# Patient Record
Sex: Male | Born: 1965 | Race: White | Hispanic: No | Marital: Married | Smoking: Former smoker
Health system: Southern US, Community
[De-identification: ages and names within clinical notes are randomized; demographics above are authoritative.]

## PROBLEM LIST (undated history)

## (undated) DIAGNOSIS — J45909 Unspecified asthma, uncomplicated: Secondary | ICD-10-CM

## (undated) DIAGNOSIS — Z86010 Personal history of colonic polyps: Secondary | ICD-10-CM

## (undated) DIAGNOSIS — E291 Testicular hypofunction: Secondary | ICD-10-CM

## (undated) DIAGNOSIS — I10 Essential (primary) hypertension: Secondary | ICD-10-CM

## (undated) DIAGNOSIS — E785 Hyperlipidemia, unspecified: Secondary | ICD-10-CM

## (undated) HISTORY — DX: Testicular hypofunction: E29.1

## (undated) HISTORY — PX: TONSILLECTOMY: SUR1361

## (undated) HISTORY — DX: Essential (primary) hypertension: I10

## (undated) HISTORY — DX: Personal history of colonic polyps: Z86.010

## (undated) HISTORY — PX: OSTEOTOMY AND ULNAR SHORTENING: SHX2140

## (undated) HISTORY — PX: CLOSED REDUCTION WRIST FRACTURE: SHX1091

## (undated) HISTORY — DX: Hyperlipidemia, unspecified: E78.5

---

## 1996-02-16 HISTORY — PX: KNEE ARTHROSCOPY W/ ACL RECONSTRUCTION: SHX1858

## 1998-05-01 ENCOUNTER — Ambulatory Visit (HOSPITAL_BASED_OUTPATIENT_CLINIC_OR_DEPARTMENT_OTHER): Admission: RE | Admit: 1998-05-01 | Discharge: 1998-05-01 | Payer: Self-pay | Admitting: Orthopedic Surgery

## 2004-02-22 ENCOUNTER — Emergency Department: Payer: Self-pay | Admitting: Emergency Medicine

## 2004-02-27 ENCOUNTER — Ambulatory Visit: Payer: Self-pay | Admitting: Specialist

## 2005-03-26 ENCOUNTER — Ambulatory Visit: Payer: Self-pay | Admitting: Unknown Physician Specialty

## 2009-01-03 ENCOUNTER — Emergency Department: Payer: Self-pay | Admitting: Emergency Medicine

## 2011-08-27 ENCOUNTER — Ambulatory Visit: Payer: Self-pay | Admitting: Unknown Physician Specialty

## 2012-12-25 ENCOUNTER — Ambulatory Visit (INDEPENDENT_AMBULATORY_CARE_PROVIDER_SITE_OTHER): Payer: Self-pay | Admitting: Family Medicine

## 2012-12-25 ENCOUNTER — Encounter: Payer: Self-pay | Admitting: Family Medicine

## 2012-12-25 VITALS — BP 110/84 | HR 75 | Temp 98.3°F | Ht 65.0 in | Wt 200.0 lb

## 2012-12-25 DIAGNOSIS — Z860101 Personal history of adenomatous and serrated colon polyps: Secondary | ICD-10-CM | POA: Insufficient documentation

## 2012-12-25 DIAGNOSIS — I10 Essential (primary) hypertension: Secondary | ICD-10-CM

## 2012-12-25 DIAGNOSIS — Z86010 Personal history of colonic polyps: Secondary | ICD-10-CM

## 2012-12-25 HISTORY — DX: Essential (primary) hypertension: I10

## 2012-12-25 HISTORY — DX: Personal history of adenomatous and serrated colon polyps: Z86.0101

## 2012-12-25 HISTORY — DX: Personal history of colonic polyps: Z86.010

## 2012-12-25 NOTE — Progress Notes (Signed)
Date:  12/25/2012   Name:  John Mckinney   DOB:  28-Sep-1965   MRN:  [id number] Gender: male Age: 47 y.o.  Primary Physician:  Hannah Beat, MD   Chief Complaint: New Patient   Subjective:   History of Present Illness:  John Mckinney is a 47 y.o. pleasant patient who presents with the following:  Had been seeing Dr. Juanetta Gosling in [place], changed jobs and now living here.   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   Hypertension, has been stable. He has been on his current medications for some time.  He also has a history of adenomatous polyp, he does have a blood relative in his 60s, his brother who died of colon cancer  There are no active problems to display for this patient.   Past Medical History  Diagnosis Date  . Hypertension   . 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 Knee surgery  1998    ACL Reconstruction  . Wrist fracture surgery      with Ulnar Osteotomy    History   Social History  . Marital Status: Married    Spouse Name: N/A    Number of Children: N/A  . Years of Education: N/A   Occupational History  . Not on file.   Social History Main Topics  . Smoking status: Former Games developer  . Smokeless tobacco: Former Neurosurgeon  . Alcohol Use: Yes  . Drug Use: No  . Sexual Activity: Not on file   Other Topics Concern  . Not on file   Social History Narrative  . No narrative on file    Family History  Problem Relation Age of Onset  . Hypertension Mother   . Hyperlipidemia Father   . Hypertension Father   . Colon cancer Brother   . Hyperlipidemia Maternal Grandfather   . Hyperlipidemia Paternal Grandmother   . Hyperlipidemia Paternal Grandfather     No Known Allergies  Medication list has been reviewed and updated.  Review of Systems:   GEN: No acute illnesses, no fevers, chills. GI: No n/v/d, eating normally Pulm: No SOB Interactive and  getting along well at home.  Otherwise, ROS is as per the HPI.   Objective:   Physical Examination: BP 110/84  Pulse 75  Temp(Src) 98.3 F (36.8 C) (Oral)  Ht 5\' 5"  (1.651 m)  Wt 200 lb (90.719 kg)  BMI 33.28 kg/m2  Ideal Body Weight: Weight in (lb) to have BMI = 25: 149.9   GEN: WDWN, NAD, Non-toxic, A & O x 3 HEENT: Atraumatic, Normocephalic. Neck supple. No masses, No LAD. Ears and Nose: No external deformity. CV: RRR, No M/G/R. No JVD. No thrill. No extra heart sounds. PULM: CTA B, no wheezes, crackles, rhonchi. No retractions. No resp. distress. No accessory muscle use. EXTR: No c/c/e NEURO Normal gait.  PSYCH: Normally interactive. Conversant. Not depressed or anxious appearing.  Calm demeanor.    Assessment & Plan:    Hypertension  History of adenomatous polyp of colon  He is really doing well, and can followup when he obtains medical insurance for a full physical and laboratory work.  There are no Patient Instructions on file for this visit.  Orders Today:  No orders of the defined types were placed in this encounter.    New medications, updates to list, dose adjustments: Meds ordered this encounter  Medications  .  lisinopril (PRINIVIL,ZESTRIL) 10 MG tablet    Sig: Take 10 mg by mouth daily.  . metoprolol tartrate (LOPRESSOR) 25 MG tablet    Sig: Take 25 mg by mouth 2 (two) times daily.    Signed,  Elpidio Galea. Brycin Kille, MD, CAQ Sports Medicine  [facility] [address] [place] [address] Phone: [phone] Fax: [phone]  Updated Complete Medication List:   Medication List       This list is accurate as of: 12/25/12  2:50 PM.  Always use your most recent med list.               lisinopril 10 MG tablet  Commonly known as:  PRINIVIL,ZESTRIL  Take 10 mg by mouth daily.     metoprolol tartrate 25 MG tablet  Commonly known as:  LOPRESSOR  Take 25 mg by mouth 2 (two) times daily.

## 2012-12-25 NOTE — Progress Notes (Signed)
Pre-visit discussion using our clinic review tool. No additional management support is needed unless otherwise documented below in the visit note.  

## 2013-03-28 ENCOUNTER — Ambulatory Visit (INDEPENDENT_AMBULATORY_CARE_PROVIDER_SITE_OTHER): Payer: PRIVATE HEALTH INSURANCE | Admitting: Family Medicine

## 2013-03-28 ENCOUNTER — Encounter: Payer: Self-pay | Admitting: Family Medicine

## 2013-03-28 VITALS — BP 128/72 | HR 63 | Temp 98.0°F | Ht 65.5 in | Wt 191.5 lb

## 2013-03-28 DIAGNOSIS — Z125 Encounter for screening for malignant neoplasm of prostate: Secondary | ICD-10-CM

## 2013-03-28 DIAGNOSIS — R5381 Other malaise: Secondary | ICD-10-CM

## 2013-03-28 DIAGNOSIS — R5383 Other fatigue: Secondary | ICD-10-CM

## 2013-03-28 DIAGNOSIS — Z79899 Other long term (current) drug therapy: Secondary | ICD-10-CM

## 2013-03-28 DIAGNOSIS — Z13220 Encounter for screening for lipoid disorders: Secondary | ICD-10-CM

## 2013-03-28 DIAGNOSIS — E291 Testicular hypofunction: Secondary | ICD-10-CM

## 2013-03-28 DIAGNOSIS — Z0000 Encounter for general adult medical examination without abnormal findings: Secondary | ICD-10-CM

## 2013-03-28 DIAGNOSIS — I10 Essential (primary) hypertension: Secondary | ICD-10-CM

## 2013-03-28 LAB — BASIC METABOLIC PANEL
BUN: 17 mg/dL (ref 6–23)
CHLORIDE: 106 meq/L (ref 96–112)
CO2: 27 mEq/L (ref 19–32)
CREATININE: 0.8 mg/dL (ref 0.4–1.5)
Calcium: 9.5 mg/dL (ref 8.4–10.5)
GFR: 104.26 mL/min (ref >60.00)
Glucose, Bld: 92 mg/dL (ref 70–99)
Potassium: 5.5 mEq/L — ABNORMAL HIGH (ref 3.5–5.1)
Sodium: 141 mEq/L (ref 135–145)

## 2013-03-28 LAB — CBC WITH DIFFERENTIAL/PLATELET
BASOS ABS: 0 10*3/uL (ref 0.0–0.1)
Basophils Relative: 0.5 % (ref 0.0–3.0)
EOS ABS: 0.1 10*3/uL (ref 0.0–0.7)
Eosinophils Relative: 1.4 % (ref 0.0–5.0)
HCT: 44 % (ref 39.0–52.0)
Hemoglobin: 14.7 g/dL (ref 13.0–17.0)
Lymphocytes Relative: 24.1 % (ref 12.0–46.0)
Lymphs Abs: 1 10*3/uL (ref 0.7–4.0)
MCHC: 33.4 g/dL (ref 30.0–36.0)
MCV: 95.8 fl (ref 78.0–100.0)
MONO ABS: 0.3 10*3/uL (ref 0.1–1.0)
Monocytes Relative: 6.6 % (ref 3.0–12.0)
NEUTROS PCT: 67.4 % (ref 43.0–77.0)
Neutro Abs: 2.8 10*3/uL (ref 1.4–7.7)
PLATELETS: 234 10*3/uL (ref 150.0–400.0)
RBC: 4.59 Mil/uL (ref 4.22–5.81)
RDW: 13.1 % (ref 11.5–14.6)
WBC: 4.2 10*3/uL — AB (ref 4.5–10.5)

## 2013-03-28 LAB — PSA: PSA: 0.67 ng/mL (ref 0.10–4.00)

## 2013-03-28 LAB — HEPATIC FUNCTION PANEL
ALT: 21 U/L (ref 0–53)
AST: 21 U/L (ref 0–37)
Albumin: 4.4 g/dL (ref 3.5–5.2)
Alkaline Phosphatase: 62 U/L (ref 39–117)
BILIRUBIN DIRECT: 0.1 mg/dL (ref 0.0–0.3)
BILIRUBIN TOTAL: 1.2 mg/dL (ref 0.3–1.2)
Total Protein: 6.7 g/dL (ref 6.0–8.3)

## 2013-03-28 LAB — LIPID PANEL
CHOLESTEROL: 188 mg/dL (ref 0–200)
HDL: 39.5 mg/dL (ref >39.00)
LDL Cholesterol: 132 mg/dL — ABNORMAL HIGH (ref 0–99)
Total CHOL/HDL Ratio: 5
Triglycerides: 85 mg/dL (ref 0.0–149.0)
VLDL: 17 mg/dL (ref 0.0–40.0)

## 2013-03-28 NOTE — Progress Notes (Signed)
Date:  03/28/2013   Name:  John Mckinney   DOB:  1965/05/05   MRN:  [id number] Gender: male Age: 48 y.o.  Primary Physician:  Hannah Beat, MD   Chief Complaint: Annual Exam   Subjective:   History of Present Illness:  John Mckinney is a 48 y.o. pleasant patient who presents with the following:  Preventative Health Maintenance Visit:  Health Maintenance Summary Reviewed and updated, unless pt declines services.  Tobacco History Reviewed. Alcohol: No concerns, no excessive use Exercise Habits: Some activity, rec at least 30 mins 5 times a week STD concerns: no risk or activity to increase risk Drug Use: None Encouraged self-testicular check  Health Maintenance  Topic Date Due  . Tetanus/tdap  04/20/1982  . Influenza Vaccine  09/15/2012     There is no immunization history on file for this patient.  HTN: Tolerating all medications without side effects Stable and at goal No CP, no sob. No HA.  BP Readings from Last 3 Encounters:  03/28/13 128/72  12/25/12 110/84    Hypogonadism: he has had multiple low testosterones and was on androgel for a decade. He has been off now for a couple of years, but with decreased libido and fatigue, wants to consider going on androgel again.  Patient Active Problem List   Diagnosis Date Noted  . Hypertension 12/25/2012  . History of adenomatous polyp of colon 12/25/2012    Past Medical History  Diagnosis Date  . Hypertension 12/25/2012  . History of adenomatous polyp of colon 12/25/2012    Past Surgical History  Procedure Laterality Date  . Knee arthroscopy w/ acl reconstruction  1998    ACL Reconstruction  . Osteotomy and ulnar shortening      Ulnar and Radial fracture    History   Social History  . Marital Status: Married    Spouse Name: N/A    Number of Children: N/A  . Years of Education: N/A   Occupational History  . Not on file.   Social History Main Topics  . Smoking status: Former Games developer  .  Smokeless tobacco: Former Neurosurgeon  . Alcohol Use: Yes  . Drug Use: No  . Sexual Activity: Not on file   Other Topics Concern  . Not on file   Social History Narrative  . No narrative on file    Family History  Problem Relation Age of Onset  . Hypertension Mother   . Hyperlipidemia Father   . Hypertension Father   . Colon cancer Brother   . Hyperlipidemia Maternal Grandfather   . Hyperlipidemia Paternal Grandmother   . Hyperlipidemia Paternal Grandfather     No Known Allergies  Medication list has been reviewed and updated.  Review of Systems:  General: Denies fever, chills, sweats. No significant weight loss. FATIGUE Eyes: Denies blurring,significant itching ENT: Denies earache, sore throat, and hoarseness. Cardiovascular: Denies chest pains, palpitations, dyspnea on exertion Respiratory: Denies cough, dyspnea at rest,wheeezing Breast: no concerns about lumps GI: Denies nausea, vomiting, diarrhea, constipation, change in bowel habits, abdominal pain, melena, hematochezia GU: Denies penile discharge, ED, urinary flow / outflow problems., DECREASED libido. No STD concerns. Musculoskeletal: Denies back pain, joint pain Derm: Denies rash, itching Neuro: Denies  paresthesias, frequent falls, frequent headaches Psych: Denies depression, anxiety Endocrine: Denies cold intolerance, heat intolerance, polydipsia Heme: Denies enlarged lymph nodes Allergy: No hayfever  Objective:   Physical Examination: BP 128/72  Pulse 63  Temp(Src) 98 F (36.7 C) (Oral)  Ht 5' 5.5" (1.664  m)  Wt 191 lb 8 oz (86.864 kg)  BMI 31.37 kg/m2  SpO2 97% Ideal Body Weight: Weight in (lb) to have BMI = 25: 152.2  GEN: well developed, well nourished, no acute distress Eyes: conjunctiva and lids normal, PERRLA, EOMI ENT: TM clear, nares clear, oral exam WNL Neck: supple, no lymphadenopathy, no thyromegaly, no JVD Pulm: clear to auscultation and percussion, respiratory effort normal CV: regular  rate and rhythm, S1-S2, no murmur, rub or gallop, no bruits, peripheral pulses normal and symmetric, no cyanosis, clubbing, edema or varicosities GI: soft, non-tender; no hepatosplenomegaly, masses; active bowel sounds all quadrants GU: no hernia, testicular mass, penile discharge Lymph: no cervical, axillary or inguinal adenopathy MSK: gait normal, muscle tone and strength WNL, no joint swelling, effusions, discoloration, crepitus  SKIN: clear, good turgor, color WNL, no rashes, lesions, or ulcerations Neuro: normal mental status, normal strength, sensation, and motion Psych: alert; oriented to person, place and time, normally interactive and not anxious or depressed in appearance.   Assessment & Plan:   Health Maintenance Exam: The patient's preventative maintenance and recommended screening tests for an annual wellness exam were reviewed in full today. Brought up to date unless services declined.  Counselled on the importance of diet, exercise, and its role in overall health and mortality. The patient's FH and SH was reviewed, including their home life, tobacco status, and drug and alcohol status.   Routine general medical examination at a health care facility  Screening for lipoid disorders - Plan: Lipid panel  Encounter for long-term (current) use of other medications - Plan: Basic metabolic panel, CBC with Differential, Hepatic function panel  Special screening for malignant neoplasm of prostate - Plan: PSA  Hypogonadism male - Plan: Testosterone, free, total:  Will check test to see if appropriate now for treatment and if in the normal or abnormal range. Discussed we would have to check cbc and psa q 6 months if a candidate.   Other malaise and fatigue - Plan: Testosterone, free, total  Hypertension: stable, cont weight loss.  There are no Patient Instructions on file for this visit.  Orders Placed This Encounter  Procedures  . Basic metabolic panel  . CBC with  Differential  . Hepatic function panel  . Lipid panel  . Testosterone, free, total  . PSA    New medications, updates to list, dose adjustments: Meds ordered this encounter  Medications  . Multiple Vitamin (MULTIVITAMIN) capsule    Sig: Take 1 capsule by mouth daily.  Marland Kitchen. KRILL OIL PO    Sig: Take by mouth.  Marland Kitchen. aspirin 81 MG tablet    Sig: Take 81 mg by mouth daily.  . Cholecalciferol (VITAMIN D PO)    Sig: Take 5,000 Units by mouth daily.  . Menaquinone-7 (VITAMIN K2) 100 MCG CAPS    Sig: Take 1 tablet by mouth daily.    Signed,  Elpidio GaleaSpencer T. Lashika Erker, MD, CAQ Sports Medicine  [facility] [address] [place] [address] Phone: [phone] Fax: [phone]    Medication List       This list is accurate as of: 03/28/13  2:20 PM.  Always use your most recent med list.               aspirin 81 MG tablet  Take 81 mg by mouth daily.     KRILL OIL PO  Take by mouth.     lisinopril 10 MG tablet  Commonly known as:  PRINIVIL,ZESTRIL  Take 10 mg by mouth daily.     metoprolol tartrate 25 MG tablet  Commonly known as:  LOPRESSOR  Take 25 mg by mouth 2 (two) times daily.     multivitamin capsule  Take 1 capsule by mouth daily.     VITAMIN D PO  Take 5,000 Units by mouth daily.     Vitamin K2 100 MCG Caps  Take 1 tablet by mouth daily.

## 2013-03-28 NOTE — Progress Notes (Signed)
Pre-visit discussion using our clinic review tool. No additional management support is needed unless otherwise documented below in the visit note.  

## 2013-03-29 ENCOUNTER — Telehealth: Payer: Self-pay | Admitting: Family Medicine

## 2013-03-29 ENCOUNTER — Encounter: Payer: Self-pay | Admitting: Family Medicine

## 2013-03-29 LAB — TESTOSTERONE, FREE, TOTAL, SHBG
SEX HORMONE BINDING: 57 nmol/L (ref 13–71)
TESTOSTERONE FREE: 48.6 pg/mL (ref 47.0–244.0)
Testosterone-% Free: 1.4 % — ABNORMAL LOW (ref 1.6–2.9)
Testosterone: 351 ng/dL (ref 300–890)

## 2013-03-29 NOTE — Telephone Encounter (Signed)
Relevant patient education assigned to patient using Emmi. ° °

## 2013-04-04 ENCOUNTER — Encounter: Payer: Self-pay | Admitting: Family Medicine

## 2013-04-05 ENCOUNTER — Telehealth: Payer: Self-pay

## 2013-04-05 NOTE — Telephone Encounter (Signed)
No answer with call

## 2013-04-05 NOTE — Telephone Encounter (Signed)
Pt left v/m returning Dr Brayton Laymanoplands call; pt can be reached [phone].

## 2013-04-06 NOTE — Telephone Encounter (Signed)
See result note from labs.

## 2013-04-06 NOTE — Telephone Encounter (Signed)
Patient returned your call. He apologizes for playing phone tag. 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

## 2013-04-11 ENCOUNTER — Emergency Department: Payer: Self-pay | Admitting: Emergency Medicine

## 2013-04-11 LAB — COMPREHENSIVE METABOLIC PANEL
ALK PHOS: 71 U/L
Albumin: 4.3 g/dL (ref 3.4–5.0)
Anion Gap: 4 — ABNORMAL LOW (ref 7–16)
BUN: 16 mg/dL (ref 7–18)
Bilirubin,Total: 0.5 mg/dL (ref 0.2–1.0)
CALCIUM: 8.9 mg/dL (ref 8.5–10.1)
Chloride: 105 mmol/L (ref 98–107)
Co2: 27 mmol/L (ref 21–32)
Creatinine: 0.87 mg/dL (ref 0.60–1.30)
EGFR (Non-African Amer.): >60
GLUCOSE: 89 mg/dL (ref 65–99)
OSMOLALITY: 273 (ref 275–301)
POTASSIUM: 4.4 mmol/L (ref 3.5–5.1)
SGOT(AST): 29 U/L (ref 15–37)
SGPT (ALT): 34 U/L (ref 12–78)
Sodium: 136 mmol/L (ref 136–145)
Total Protein: 7.5 g/dL (ref 6.4–8.2)

## 2013-04-11 LAB — CBC
HCT: 42.9 % (ref 40.0–52.0)
HGB: 14.6 g/dL (ref 13.0–18.0)
MCH: 32.1 pg (ref 26.0–34.0)
MCHC: 33.9 g/dL (ref 32.0–36.0)
MCV: 95 fL (ref 80–100)
Platelet: 220 10*3/uL (ref 150–440)
RBC: 4.53 10*6/uL (ref 4.40–5.90)
RDW: 12.5 % (ref 11.5–14.5)
WBC: 14.3 10*3/uL — AB (ref 3.8–10.6)

## 2013-04-11 LAB — URINALYSIS, COMPLETE
BACTERIA: NONE SEEN
BILIRUBIN, UR: NEGATIVE
Blood: NEGATIVE
GLUCOSE, UR: NEGATIVE mg/dL (ref 0–75)
Leukocyte Esterase: NEGATIVE
Nitrite: NEGATIVE
Ph: 5 (ref 4.5–8.0)
Protein: NEGATIVE
RBC,UR: <1 /HPF (ref 0–5)
Specific Gravity: 1.02 (ref 1.003–1.030)
Squamous Epithelial: NONE SEEN
WBC UR: 1 /HPF (ref 0–5)

## 2013-04-11 LAB — CK TOTAL AND CKMB (NOT AT ARMC)
CK, TOTAL: 193 U/L
CK-MB: 1.8 ng/mL (ref 0.5–3.6)

## 2013-04-11 LAB — TROPONIN I: Troponin-I: <0.02 ng/mL

## 2013-04-11 LAB — LIPASE, BLOOD: LIPASE: 168 U/L (ref 73–393)

## 2013-04-12 ENCOUNTER — Emergency Department: Payer: Self-pay | Admitting: Emergency Medicine

## 2013-04-12 LAB — COMPREHENSIVE METABOLIC PANEL
ALBUMIN: 3.6 g/dL (ref 3.4–5.0)
AST: 21 U/L (ref 15–37)
Alkaline Phosphatase: 61 U/L
Anion Gap: 4 — ABNORMAL LOW (ref 7–16)
BUN: 15 mg/dL (ref 7–18)
Bilirubin,Total: 0.4 mg/dL (ref 0.2–1.0)
CALCIUM: 8.9 mg/dL (ref 8.5–10.1)
CO2: 29 mmol/L (ref 21–32)
Chloride: 105 mmol/L (ref 98–107)
Creatinine: 0.95 mg/dL (ref 0.60–1.30)
EGFR (African American): >60
EGFR (Non-African Amer.): >60
Glucose: 118 mg/dL — ABNORMAL HIGH (ref 65–99)
Osmolality: 278 (ref 275–301)
POTASSIUM: 4.3 mmol/L (ref 3.5–5.1)
SGPT (ALT): 28 U/L (ref 12–78)
Sodium: 138 mmol/L (ref 136–145)
Total Protein: 7.3 g/dL (ref 6.4–8.2)

## 2013-04-12 LAB — CBC WITH DIFFERENTIAL/PLATELET
BASOS PCT: 0.2 %
Basophil #: 0 10*3/uL (ref 0.0–0.1)
Eosinophil #: 0.1 10*3/uL (ref 0.0–0.7)
Eosinophil %: 0.5 %
HCT: 38 % — ABNORMAL LOW (ref 40.0–52.0)
HGB: 12.7 g/dL — AB (ref 13.0–18.0)
Lymphocyte #: 1.1 10*3/uL (ref 1.0–3.6)
Lymphocyte %: 9.1 %
MCH: 32.2 pg (ref 26.0–34.0)
MCHC: 33.4 g/dL (ref 32.0–36.0)
MCV: 97 fL (ref 80–100)
Monocyte #: 0.6 x10 3/mm (ref 0.2–1.0)
Monocyte %: 5.2 %
NEUTROS ABS: 10 10*3/uL — AB (ref 1.4–6.5)
NEUTROS PCT: 85 %
Platelet: 200 10*3/uL (ref 150–440)
RBC: 3.94 10*6/uL — AB (ref 4.40–5.90)
RDW: 12.9 % (ref 11.5–14.5)
WBC: 11.8 10*3/uL — ABNORMAL HIGH (ref 3.8–10.6)

## 2013-04-12 LAB — URINALYSIS, COMPLETE
BACTERIA: NONE SEEN
Bilirubin,UR: NEGATIVE
Blood: NEGATIVE
GLUCOSE, UR: NEGATIVE mg/dL (ref 0–75)
Ketone: NEGATIVE
Leukocyte Esterase: NEGATIVE
NITRITE: NEGATIVE
Ph: 7 (ref 4.5–8.0)
Protein: NEGATIVE
RBC,UR: <1 /HPF (ref 0–5)
SPECIFIC GRAVITY: 1.016 (ref 1.003–1.030)
Squamous Epithelial: NONE SEEN
WBC UR: NONE SEEN /HPF (ref 0–5)

## 2013-04-12 LAB — LIPASE, BLOOD: LIPASE: 145 U/L (ref 73–393)

## 2013-05-16 DIAGNOSIS — R5383 Other fatigue: Secondary | ICD-10-CM | POA: Insufficient documentation

## 2013-05-16 DIAGNOSIS — E291 Testicular hypofunction: Secondary | ICD-10-CM | POA: Insufficient documentation

## 2013-05-16 DIAGNOSIS — N401 Enlarged prostate with lower urinary tract symptoms: Secondary | ICD-10-CM

## 2013-05-16 DIAGNOSIS — N529 Male erectile dysfunction, unspecified: Secondary | ICD-10-CM | POA: Insufficient documentation

## 2013-05-16 DIAGNOSIS — N138 Other obstructive and reflux uropathy: Secondary | ICD-10-CM | POA: Insufficient documentation

## 2014-05-24 DIAGNOSIS — Z79899 Other long term (current) drug therapy: Secondary | ICD-10-CM | POA: Insufficient documentation

## 2014-05-24 DIAGNOSIS — K4020 Bilateral inguinal hernia, without obstruction or gangrene, not specified as recurrent: Secondary | ICD-10-CM | POA: Insufficient documentation

## 2014-07-22 ENCOUNTER — Ambulatory Visit: Payer: Self-pay | Admitting: Family Medicine

## 2014-08-16 ENCOUNTER — Encounter: Payer: Self-pay | Admitting: Family Medicine

## 2014-08-16 ENCOUNTER — Ambulatory Visit (INDEPENDENT_AMBULATORY_CARE_PROVIDER_SITE_OTHER): Payer: PRIVATE HEALTH INSURANCE | Admitting: Family Medicine

## 2014-08-16 VITALS — BP 125/80 | HR 65 | Temp 98.1°F | Resp 16 | Ht 67.0 in | Wt 190.6 lb

## 2014-08-16 DIAGNOSIS — M25511 Pain in right shoulder: Secondary | ICD-10-CM | POA: Diagnosis not present

## 2014-08-16 DIAGNOSIS — E785 Hyperlipidemia, unspecified: Secondary | ICD-10-CM | POA: Insufficient documentation

## 2014-08-16 DIAGNOSIS — I10 Essential (primary) hypertension: Secondary | ICD-10-CM

## 2014-08-16 DIAGNOSIS — M79671 Pain in right foot: Secondary | ICD-10-CM | POA: Diagnosis not present

## 2014-08-16 MED ORDER — METOPROLOL TARTRATE 25 MG PO TABS: 25.0000 mg | ORAL_TABLET | Freq: Two times a day (BID) | ORAL | Status: DC

## 2014-08-16 MED ORDER — LISINOPRIL 10 MG PO TABS: 10.0000 mg | ORAL_TABLET | Freq: Every day | ORAL | Status: DC

## 2014-08-16 NOTE — Progress Notes (Signed)
Name: John SenterGregory C Righter   MRN: [id number]    DOB: 02/17/1965   Date:08/16/2014       Progress Note  Subjective  Chief Complaint  Chief Complaint  Patient presents with  . Hyperlipidemia    F/U  . Foot Injury    Pain in Right FFot 6 mo  . Shoulder Pain    Right 5-6 months     HPI  Here for elevated lipids, but would like to delay testing for a few months.   C/o R lateral pain  X 6 months.  No acute injury.  Pain worse with post running and prolonged  Standing.  C/o R. shoulder pain.  No acute injury.  Pain for 6 mos. Has crepitus.  Pain with internal and external rotation and with sleeping on R. side Past Medical History  Diagnosis Date  . Hypertension 12/25/2012  . History of adenomatous polyp of colon 12/25/2012    Past Surgical History  Procedure Laterality Date  . Knee arthroscopy w/ acl reconstruction  1998    ACL Reconstruction  . Osteotomy and ulnar shortening      Ulnar and Radial fracture    Family History  Problem Relation Age of Onset  . Hypertension Mother   . Hyperlipidemia Father   . Hypertension Father   . Colon cancer Brother   . Hyperlipidemia Maternal Grandfather   . Hyperlipidemia Paternal Grandmother   . Hyperlipidemia Paternal Grandfather     History   Social History  . Marital Status: Married    Spouse Name: N/A  . Number of Children: N/A  . Years of Education: N/A   Occupational History  . Not on file.   Social History Main Topics  . Smoking status: Former Games developermoker  . Smokeless tobacco: Former NeurosurgeonUser  . Alcohol Use: No  . Drug Use: No  . Sexual Activity: Not on file   Other Topics Concern  . Not on file   Social History Narrative     Current outpatient prescriptions:  .  aspirin 81 MG tablet, Take 81 mg by mouth daily., Disp: , Rfl:  .  Cholecalciferol (VITAMIN D PO), Take 5,000 Units by mouth daily., Disp: , Rfl:  .  KRILL OIL PO, Take by mouth., Disp: , Rfl:  .  lisinopril (PRINIVIL,ZESTRIL) 10 MG tablet, Take 10 mg by mouth  daily., Disp: , Rfl:  .  Menaquinone-7 (VITAMIN K2) 100 MCG CAPS, Take 1 tablet by mouth daily., Disp: , Rfl:  .  metoprolol tartrate (LOPRESSOR) 25 MG tablet, Take 25 mg by mouth 2 (two) times daily., Disp: , Rfl:  .  Multiple Vitamin (MULTIVITAMIN) capsule, Take 1 capsule by mouth daily., Disp: , Rfl:  .  testosterone (ANDROGEL) 50 MG/5GM (1%) GEL, Place 10 g onto the skin daily., Disp: , Rfl:   No Known Allergies   Review of Systems  Constitutional: Negative.  Negative for fever, chills and malaise/fatigue.  HENT: Negative.   Eyes: Negative.  Negative for blurred vision and double vision.  Respiratory: Negative.  Negative for cough, sputum production, shortness of breath and wheezing.   Cardiovascular: Negative.  Negative for chest pain, palpitations, orthopnea and leg swelling.  Gastrointestinal: Negative.  Negative for heartburn, nausea, vomiting, abdominal pain, diarrhea and blood in stool.  Genitourinary: Negative for dysuria and frequency.  Musculoskeletal: Positive for joint pain (R/ shuolder and foot pain).  Skin: Negative.  Negative for rash.  Neurological: Negative for weakness and headaches.      Objective  Filed Vitals:  08/16/14 1605  BP: 148/88  Pulse: 65  Temp: 98.1 F (36.7 C)  Resp: 16  Height:  (1.702 m)  Weight: 190 lb 9.6 oz (86.456 kg)    Physical Exam  Constitutional: He is well-developed, well-nourished, and in no distress. No distress.  HENT:  Head: Normocephalic and atraumatic.  Eyes: Conjunctivae and EOM are normal. Pupils are equal, round, and reactive to light. No scleral icterus.  Neck: Normal range of motion. Neck supple. No thyromegaly present.  Cardiovascular: Normal rate, regular rhythm, normal heart sounds and intact distal pulses.   Occasional extrasystoles are present. Exam reveals no gallop and no friction rub.   No murmur heard. Pulmonary/Chest: Effort normal and breath sounds normal. No respiratory distress. He has no  wheezes. He has no rales.  Abdominal: Soft. Bowel sounds are normal. He exhibits no mass. There is no tenderness.  Musculoskeletal: He exhibits no edema.  Tenderness to palp. Along proximal R. 5th metatarsal.  Pain  Along R. Ant. Shoulder line.  Crepitus with shoulder ROM.  Pain ant. With intern al and external rotation.  Lymphadenopathy:    He has no cervical adenopathy.  Vitals reviewed.        Assessment & Plan  Problem List Items Addressed This Visit      Other   Dyslipidemia - Primary    Other Visit Diagnoses    Right foot pain        Shoulder joint pain, right           Meds ordered this encounter  Medications  . testosterone (ANDROGEL) 50 MG/5GM (1%) GEL    Sig: Place 10 g onto the skin daily.   1. Dyslipidemia   2. Right foot pain  - Ambulatory referral to Podiatry  3. Shoulder joint pain, right  - Ambulatory referral to Orthopedic Surgery - lisinopril (PRINIVIL,ZESTRIL) 10 MG tablet; Take 1 tablet (10 mg total) by mouth daily.  Dispense: 90 tablet; Refill: 3 - metoprolol tartrate (LOPRESSOR) 25 MG tablet; Take 1 tablet (25 mg total) by mouth 2 (two) times daily.  Dispense: 180 tablet; Refill: 3  4. Essential hypertension

## 2014-08-16 NOTE — Patient Instructions (Signed)
Patient would like to delay recheck of Lipids and CMP until the fall of this year.

## 2014-09-30 ENCOUNTER — Telehealth: Payer: Self-pay | Admitting: Family Medicine

## 2014-09-30 NOTE — Telephone Encounter (Signed)
No show after I did [facility] ref.jh

## 2014-09-30 NOTE — Telephone Encounter (Signed)
Tiara from [facility] said pt was a no show for his appt today.  Her call back number is [phone]

## 2014-10-07 NOTE — Telephone Encounter (Signed)
Ok.  Be sure that that is noted prominently in chart so when he asks again, I don't bother [facility] again.-jh

## 2014-12-17 ENCOUNTER — Ambulatory Visit: Payer: PRIVATE HEALTH INSURANCE | Admitting: Family Medicine

## 2015-04-01 ENCOUNTER — Ambulatory Visit (INDEPENDENT_AMBULATORY_CARE_PROVIDER_SITE_OTHER): Payer: BLUE CROSS/BLUE SHIELD | Admitting: Podiatry

## 2015-04-01 ENCOUNTER — Encounter: Payer: Self-pay | Admitting: Podiatry

## 2015-04-01 ENCOUNTER — Ambulatory Visit (INDEPENDENT_AMBULATORY_CARE_PROVIDER_SITE_OTHER): Payer: BLUE CROSS/BLUE SHIELD

## 2015-04-01 VITALS — BP 168/108 | HR 72 | Resp 18

## 2015-04-01 DIAGNOSIS — M722 Plantar fascial fibromatosis: Secondary | ICD-10-CM | POA: Diagnosis not present

## 2015-04-01 DIAGNOSIS — R52 Pain, unspecified: Secondary | ICD-10-CM | POA: Diagnosis not present

## 2015-04-01 MED ORDER — DICLOFENAC SODIUM 1 % TD GEL: 2.0000 g | Freq: Four times a day (QID) | TRANSDERMAL | Status: DC

## 2015-04-01 NOTE — Progress Notes (Signed)
   Subjective:    Patient ID: John Mckinney, male    DOB: 1965-08-26, 50 y.o.   MRN: [id number]  HPI  Patient presents for concerns of left foot pain in his heel. He has a history of a plantar fascia tear on the right foot several years ago from a basketball injury. The left heel has been ongoing for a couple of years and the pain level does vary. No tingling or numbness. No swelling or redness. He is a PT and has been doing some of the stretching and icing. He has been taking ibuprofen for it at times if it hurting.   Review of Systems  All other systems reviewed and are negative.      Objective:   Physical Exam General: AAO x3, NAD  Dermatological: Skin is warm, dry and supple bilateral. Nails x 10 are well manicured; remaining integument appears unremarkable at this time. There are no open sores, no preulcerative lesions, no rash or signs of infection present.  Vascular: Dorsalis Pedis artery and Posterior Tibial artery pedal pulses are 2/4 bilateral with immedate capillary fill time. Pedal hair growth present. No varicosities and no lower extremity edema present bilateral. There is no pain with calf compression, swelling, warmth, erythema.   Neruologic: Grossly intact via light touch bilateral. Vibratory intact via tuning fork bilateral. Protective threshold with Semmes Wienstein monofilament intact to all pedal sites bilateral. Patellar and Achilles deep tendon reflexes 2+ bilateral. No Babinski or clonus noted bilateral.   Musculoskeletal:Tenderness to palpation along the plantar medial tubercle of the calcaneus at the insertion of plantar fascia on the left foot. There is no pain along the course of the plantar fascia within the arch of the foot. Plantar fascia appears to be intact. There is no pain with lateral compression of the calcaneus or pain with vibratory sensation. There is no pain along the course or insertion of the achilles tendon. No other areas of tenderness to bilateral  lower extremities. MMT 5/5, ROM WNL. There is a decrease in medial arch height upon weightbearing.  Gait: Unassisted, Nonantalgic.      Assessment & Plan:  50 year old male with left foot heel pain, likely plantar fasciitis -Treatment options discussed including all alternatives, risks, and complications -Etiology of symptoms were discussed -X-rays were obtained and reviewed with the patient. No definitive evidence of acute fracture. Inferior calcaneal spurs present. -Discussed steroid injections and wishes to hold off. -Voltaren gel -Ordered physical therapy at his request. -Given his flatfoot I do recommend orthotics. He will to proceed with him today. He was scanned for orthotics were sent to [facility] labs. -Follow-up in 3-4 weeks or sooner if any problems arise. In the meantime, encouraged to call the office with any questions, concerns, change in symptoms.   Ovid Curd, DPm

## 2015-04-01 NOTE — Patient Instructions (Signed)

## 2015-04-02 ENCOUNTER — Telehealth: Payer: Self-pay | Admitting: *Deleted

## 2015-04-02 MED ORDER — DICLOFENAC SODIUM 1 % TD GEL: 2.0000 g | Freq: Four times a day (QID) | TRANSDERMAL | Status: DC

## 2015-04-02 NOTE — Telephone Encounter (Addendum)
Prior authorization started for Voltaren Gel.  04/03/2015-[employer] DENIED VOLTAREN GEL, states pt has not used an oral doctor prescribed antiinflammatory medication.  04/02/2014-PT STATED HE HAD A MEDICATION that needed prior authorization.  I told him I was going to call him and let him know his insurance had denied.  I informed him that Dr. Ardelle Anton was going to prescribe a compound from pharmacy, that works well for inflammation, and pt states he'll go ahead and pay cash for the Voltaren gel.

## 2015-04-03 NOTE — Telephone Encounter (Signed)
Lets do the compound cream. i told him if the voltaren was not approved we would do that

## 2015-04-10 ENCOUNTER — Encounter: Payer: Self-pay | Admitting: Family Medicine

## 2015-04-10 ENCOUNTER — Ambulatory Visit: Admit: 2015-04-10 | Payer: BLUE CROSS/BLUE SHIELD

## 2015-04-10 ENCOUNTER — Ambulatory Visit
Admission: RE | Admit: 2015-04-10 | Discharge: 2015-04-10 | Disposition: A | Discharge status: Home / Self Care | Payer: BLUE CROSS/BLUE SHIELD | Source: Ambulatory Visit | Attending: Family Medicine | Admitting: Family Medicine

## 2015-04-10 ENCOUNTER — Ambulatory Visit (INDEPENDENT_AMBULATORY_CARE_PROVIDER_SITE_OTHER): Payer: BLUE CROSS/BLUE SHIELD | Admitting: Family Medicine

## 2015-04-10 VITALS — BP 134/86 | HR 78 | Resp 16 | Ht 67.0 in | Wt 190.0 lb

## 2015-04-10 DIAGNOSIS — R7611 Nonspecific reaction to tuberculin skin test without active tuberculosis: Secondary | ICD-10-CM

## 2015-04-10 NOTE — Progress Notes (Signed)
Name: John Mckinney   MRN: [id number]    DOB: 05/27/1965   Date:04/10/2015       Progress Note  Subjective  Chief Complaint  Chief Complaint  Patient presents with  . 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  Has had newg. Serial CXRs in past.  Time for another for his employment.  No problem-specific assessment & plan notes found for this encounter.   Past Medical History  Diagnosis Date  . Hypertension 12/25/2012  . History of adenomatous polyp of colon 12/25/2012    Social History  Substance Use Topics  . Smoking status: Former Games developer  . Smokeless tobacco: Former Neurosurgeon  . Alcohol Use: No     Current outpatient prescriptions:  .  ANDROGEL PUMP 20.25 MG/ACT (1.62%) GEL, Reported on 04/10/2015, Disp: , Rfl: 2 .  Cholecalciferol (VITAMIN D PO), Take 5,000 Units by mouth daily. Reported on 04/10/2015, Disp: , Rfl:  .  diclofenac sodium (VOLTAREN) 1 % GEL, Apply 2 g topically 4 (four) times daily. Rub into affected area of foot 2 to 4 times daily, Disp: 100 g, Rfl: 2 .  KRILL OIL PO, Take by mouth. Reported on 04/10/2015, Disp: , Rfl:  .  lisinopril (PRINIVIL,ZESTRIL) 10 MG tablet, Take 1 tablet (10 mg total) by mouth daily., Disp: 90 tablet, Rfl: 3 .  Menaquinone-7 (VITAMIN K2) 100 MCG CAPS, Take 1 tablet by mouth daily. Reported on 04/10/2015, Disp: , Rfl:  .  metoprolol tartrate (LOPRESSOR) 25 MG tablet, Take 1 tablet (25 mg total) by mouth 2 (two) times daily., Disp: 180 tablet, Rfl: 3 .  Multiple Vitamin (MULTIVITAMIN) capsule, Take 1 capsule by mouth daily. Reported on 04/10/2015, Disp: , Rfl:  .  Testosterone (ANDROGEL PUMP) 20.25 MG/ACT (1.62%) GEL, Reported on 04/10/2015, Disp: , Rfl:  .  testosterone (ANDROGEL) 50 MG/5GM (1%) GEL, Place 10 g onto the skin daily. Reported on 04/10/2015, Disp: , Rfl:   Not on File  Review of Systems  Constitutional: Negative for fever and chills.       No night sweats   Respiratory: Negative for cough, hemoptysis, sputum production, shortness of breath and wheezing.   Cardiovascular: Negative for chest pain, palpitations and PND.  Gastrointestinal: Negative for heartburn, abdominal pain and blood in stool.      Objective  Filed Vitals:   04/10/15 0904  BP: 134/86  Pulse: 78  Resp: 16  Height:  (1.702 m)  Weight: 190 lb (86.183 kg)     Physical Exam  Constitutional: He is well-developed, well-nourished, and in no distress. No distress.  HENT:  Head: Normocephalic and atraumatic.  Cardiovascular: Normal rate, regular rhythm and normal heart sounds.  Exam reveals no gallop and no friction rub.   No murmur heard. Pulmonary/Chest: Effort normal and breath sounds normal. No respiratory distress. He has no wheezes. He has no rales.  Vitals reviewed.     No results found for this or any previous visit (from the past 2160 hour(s)).   Assessment & Plan  1. Positive PPD, treated  - DG Chest 2 View; Future

## 2015-04-29 ENCOUNTER — Ambulatory Visit: Payer: BLUE CROSS/BLUE SHIELD | Admitting: Podiatry

## 2015-05-26 ENCOUNTER — Ambulatory Visit (INDEPENDENT_AMBULATORY_CARE_PROVIDER_SITE_OTHER): Payer: PRIVATE HEALTH INSURANCE | Admitting: Family Medicine

## 2015-05-26 ENCOUNTER — Encounter: Payer: Self-pay | Admitting: Family Medicine

## 2015-05-26 ENCOUNTER — Other Ambulatory Visit: Payer: Self-pay | Admitting: *Deleted

## 2015-05-26 VITALS — BP 145/95 | HR 81 | Temp 98.1°F | Resp 16 | Ht 67.0 in | Wt 219.0 lb

## 2015-05-26 DIAGNOSIS — Z860100 Personal history of colon polyps, unspecified: Secondary | ICD-10-CM | POA: Insufficient documentation

## 2015-05-26 DIAGNOSIS — Z79899 Other long term (current) drug therapy: Secondary | ICD-10-CM | POA: Insufficient documentation

## 2015-05-26 DIAGNOSIS — Z0000 Encounter for general adult medical examination without abnormal findings: Secondary | ICD-10-CM

## 2015-05-26 DIAGNOSIS — E785 Hyperlipidemia, unspecified: Secondary | ICD-10-CM | POA: Diagnosis not present

## 2015-05-26 DIAGNOSIS — E669 Obesity, unspecified: Secondary | ICD-10-CM | POA: Diagnosis not present

## 2015-05-26 DIAGNOSIS — I10 Essential (primary) hypertension: Secondary | ICD-10-CM | POA: Diagnosis not present

## 2015-05-26 MED ORDER — LISINOPRIL 20 MG PO TABS: 20.0000 mg | ORAL_TABLET | Freq: Every day | ORAL | Status: DC

## 2015-05-26 MED ORDER — LISINOPRIL 20 MG PO TABS: 10.0000 mg | ORAL_TABLET | Freq: Every day | ORAL | Status: DC

## 2015-05-26 NOTE — Progress Notes (Signed)
Name: John Mckinney   MRN: [id number]    DOB: 02/27/1965   Date:05/26/2015       Progress Note  Subjective  Chief Complaint  Chief Complaint  Patient presents with  . Annual Exam    HPI Here for annual health maintenance examination.  He is frustrated with weight.  Althouigh weight from last visit listed at 190 was in error.   He has seen Dr. Achilles Dunkope last week and prostate ok.  He was continued on his Androgel.   HE has some plantar fascitis and uses Voltaren gel and orthotics.  It is improving.Marland Kitchen.  He may want to change to Meloxicam for a short duation The future. No problem-specific assessment & plan notes found for this encounter.   Past Medical History  Diagnosis Date  . Hypertension 12/25/2012  . History of adenomatous polyp of colon 12/25/2012    Past Surgical History  Procedure Laterality Date  . Knee arthroscopy w/ acl reconstruction  1998    ACL Reconstruction  . Osteotomy and ulnar shortening      Ulnar and Radial fracture  . Tonsillectomy Bilateral age 89    Family History  Problem Relation Age of Onset  . Hypertension Mother   . Hyperlipidemia Father   . Hypertension Father   . Colon cancer Brother   . Hyperlipidemia Maternal Grandfather   . Hyperlipidemia Paternal Grandmother   . Hyperlipidemia Paternal Grandfather     Social History   Social History  . Marital Status: Married    Spouse Name: N/A  . Number of Children: N/A  . Years of Education: N/A   Occupational History  . Not on file.   Social History Main Topics  . Smoking status: Former Games developermoker  . Smokeless tobacco: Former NeurosurgeonUser  . Alcohol Use: No  . Drug Use: No  . Sexual Activity: Not on file   Other Topics Concern  . Not on file   Social History Narrative     Current outpatient prescriptions:  .  Cholecalciferol (VITAMIN D PO), Take 5,000 Units by mouth daily. Reported on 04/10/2015, Disp: , Rfl:  .  Coenzyme Q10 (CO Q-10) 100 MG CAPS, Take 1 capsule by mouth daily., Disp: , Rfl:  .   diclofenac sodium (VOLTAREN) 1 % GEL, Apply 2 g topically 4 (four) times daily. Rub into affected area of foot 2 to 4 times daily, Disp: 100 g, Rfl: 2 .  KRILL OIL PO, Take by mouth. Reported on 04/10/2015, Disp: , Rfl:  .  lisinopril (PRINIVIL,ZESTRIL) 20 MG tablet, Take 0.5 tablets (10 mg total) by mouth daily., Disp: 90 tablet, Rfl: 3 .  metoprolol tartrate (LOPRESSOR) 25 MG tablet, Take 1 tablet (25 mg total) by mouth 2 (two) times daily., Disp: 180 tablet, Rfl: 3 .  Multiple Vitamin (MULTIVITAMIN) capsule, Take 1 capsule by mouth daily. Reported on 04/10/2015, Disp: , Rfl:  .  Testosterone (ANDROGEL PUMP) 20.25 MG/ACT (1.62%) GEL, Reported on 04/10/2015, Disp: , Rfl:  .  fexofenadine (ALLEGRA) 180 MG tablet, Take 180 mg by mouth daily as needed., Disp: , Rfl:   Not on File   Review of Systems  Constitutional: Negative for fever, chills, weight loss and malaise/fatigue.  HENT: Negative for hearing loss.   Eyes: Negative for blurred vision and double vision.  Respiratory: Negative for cough, shortness of breath and wheezing.   Cardiovascular: Negative for chest pain, palpitations and leg swelling.  Gastrointestinal: Negative for heartburn, abdominal pain and blood in stool.  Genitourinary: Negative for  dysuria, urgency and frequency.  Musculoskeletal:       Plantar fascitis  Skin: Negative for rash.  Neurological: Negative for dizziness, tremors, weakness and headaches.      Objective  Filed Vitals:   05/26/15 0900 05/26/15 1023  BP: 149/99 145/95  Pulse: 81   Temp: 98.1 F (36.7 C)   TempSrc: Oral   Resp: 16   Height:  (1.702 m)   Weight: 219 lb (99.338 kg)     Physical Exam  Constitutional: He is well-developed, well-nourished, and in no distress. No distress.  HENT:  Head: Normocephalic and atraumatic.  Eyes: Conjunctivae and EOM are normal. Pupils are equal, round, and reactive to light. No scleral icterus.  Neck: Normal range of motion. Neck supple. Carotid  bruit is not present. No thyromegaly present.  Cardiovascular: Normal rate and normal heart sounds.  Exam reveals no gallop and no friction rub.   No murmur heard. Pulmonary/Chest: Effort normal and breath sounds normal. No respiratory distress. He has no wheezes. He has no rales.  Abdominal: Soft. Bowel sounds are normal. He exhibits no distension, no abdominal bruit and no mass. There is no tenderness.  Musculoskeletal: He exhibits no edema.  Lymphadenopathy:    He has no cervical adenopathy.  Neurological: He is alert.  Vitals reviewed.      No results found for this or any previous visit (from the past 2160 hour(s)).   Assessment & Plan  Problem List Items Addressed This Visit      Cardiovascular and Mediastinum   Essential hypertension   Relevant Medications   lisinopril (PRINIVIL,ZESTRIL) 20 MG tablet   Other Relevant Orders   Comprehensive Metabolic Panel (CMET)   CBC with Differential     Other   Dyslipidemia   Relevant Orders   Lipid Profile   Health maintenance examination - Primary   Obesity   Relevant Orders   TSH   Hx of colonic polyps   Relevant Orders   Ambulatory referral to Gastroenterology      Meds ordered this encounter  Medications  . fexofenadine (ALLEGRA) 180 MG tablet    Sig: Take 180 mg by mouth daily as needed.  . Coenzyme Q10 (CO Q-10) 100 MG CAPS    Sig: Take 1 capsule by mouth daily.  Marland Kitchen lisinopril (PRINIVIL,ZESTRIL) 20 MG tablet    Sig: Take 0.5 tablets (10 mg total) by mouth daily.    Dispense:  90 tablet    Refill:  3   1. Health maintenance examination   2. Essential hypertension  - lisinopril (PRINIVIL,ZESTRIL) 20 MG tablet; Take 0.5 tablets (10 mg total) by mouth daily.  Dispense: 90 tablet; Refill: 3 - Comprehensive Metabolic Panel (CMET) - CBC with Differential Cont  Metooprolol 3. Dyslipidemia  - Lipid Profile  4. Obesity  - TSH 5. Hx of colon polyps-  Refer to Dr. Mechele Collin for colonoscopy

## 2015-05-27 ENCOUNTER — Ambulatory Visit: Payer: BLUE CROSS/BLUE SHIELD | Admitting: Podiatry

## 2015-06-25 LAB — CBC WITH DIFFERENTIAL/PLATELET
BASOS ABS: 0 10*3/uL (ref 0.0–0.2)
Basos: 0 %
EOS (ABSOLUTE): 0.1 10*3/uL (ref 0.0–0.4)
Eos: 2 %
HEMOGLOBIN: 16.7 g/dL (ref 12.6–17.7)
Hematocrit: 46.6 % (ref 37.5–51.0)
IMMATURE GRANS (ABS): 0.1 10*3/uL (ref 0.0–0.1)
Immature Granulocytes: 2 %
LYMPHS ABS: 1.6 10*3/uL (ref 0.7–3.1)
LYMPHS: 22 %
MCH: 32.4 pg (ref 26.6–33.0)
MCHC: 35.8 g/dL — AB (ref 31.5–35.7)
MCV: 90 fL (ref 79–97)
MONOCYTES: 9 %
Monocytes Absolute: 0.6 10*3/uL (ref 0.1–0.9)
NEUTROS PCT: 65 %
Neutrophils Absolute: 4.7 10*3/uL (ref 1.4–7.0)
Platelets: 276 10*3/uL (ref 150–379)
RBC: 5.16 x10E6/uL (ref 4.14–5.80)
RDW: 12.4 % (ref 12.3–15.4)
WBC: 7.2 10*3/uL (ref 3.4–10.8)

## 2015-06-25 LAB — COMPREHENSIVE METABOLIC PANEL
A/G RATIO: 2 (ref 1.2–2.2)
ALBUMIN: 4.3 g/dL (ref 3.5–5.5)
ALT: 30 IU/L (ref 0–44)
AST: 22 IU/L (ref 0–40)
Alkaline Phosphatase: 57 IU/L (ref 39–117)
BILIRUBIN TOTAL: 0.4 mg/dL (ref 0.0–1.2)
BUN / CREAT RATIO: 14 (ref 9–20)
BUN: 13 mg/dL (ref 6–24)
CHLORIDE: 99 mmol/L (ref 96–106)
CO2: 26 mmol/L (ref 18–29)
Calcium: 9.5 mg/dL (ref 8.7–10.2)
Creatinine, Ser: 0.94 mg/dL (ref 0.76–1.27)
GFR calc non Af Amer: 93 mL/min/{1.73_m2} (ref >59)
GFR, EST AFRICAN AMERICAN: 107 mL/min/{1.73_m2} (ref >59)
Globulin, Total: 2.2 g/dL (ref 1.5–4.5)
Glucose: 91 mg/dL (ref 65–99)
POTASSIUM: 5 mmol/L (ref 3.5–5.2)
SODIUM: 141 mmol/L (ref 134–144)
TOTAL PROTEIN: 6.5 g/dL (ref 6.0–8.5)

## 2015-06-25 LAB — LIPID PANEL
Chol/HDL Ratio: 5.5 ratio units — ABNORMAL HIGH (ref 0.0–5.0)
Cholesterol, Total: 202 mg/dL — ABNORMAL HIGH (ref 100–199)
HDL: 37 mg/dL — ABNORMAL LOW (ref >39)
LDL Calculated: 132 mg/dL — ABNORMAL HIGH (ref 0–99)
Triglycerides: 167 mg/dL — ABNORMAL HIGH (ref 0–149)
VLDL Cholesterol Cal: 33 mg/dL (ref 5–40)

## 2015-06-25 LAB — TSH: TSH: 2.74 u[IU]/mL (ref 0.450–4.500)

## 2015-07-06 ENCOUNTER — Encounter: Payer: Self-pay | Admitting: Family Medicine

## 2015-08-04 ENCOUNTER — Ambulatory Visit: Payer: PRIVATE HEALTH INSURANCE | Admitting: Family Medicine

## 2015-08-14 ENCOUNTER — Encounter: Payer: Self-pay | Admitting: Family Medicine

## 2015-08-14 ENCOUNTER — Ambulatory Visit (INDEPENDENT_AMBULATORY_CARE_PROVIDER_SITE_OTHER): Payer: BLUE CROSS/BLUE SHIELD | Admitting: Family Medicine

## 2015-08-14 VITALS — BP 120/80 | HR 78 | Temp 98.2°F | Resp 16 | Ht 67.0 in | Wt 213.0 lb

## 2015-08-14 DIAGNOSIS — E669 Obesity, unspecified: Secondary | ICD-10-CM | POA: Diagnosis not present

## 2015-08-14 DIAGNOSIS — I10 Essential (primary) hypertension: Secondary | ICD-10-CM

## 2015-08-14 DIAGNOSIS — E291 Testicular hypofunction: Secondary | ICD-10-CM

## 2015-08-14 DIAGNOSIS — E785 Hyperlipidemia, unspecified: Secondary | ICD-10-CM

## 2015-08-14 DIAGNOSIS — R5383 Other fatigue: Secondary | ICD-10-CM

## 2015-08-14 NOTE — Patient Instructions (Signed)
Check lipids on return

## 2015-08-14 NOTE — Progress Notes (Signed)
Name: John Mckinney   MRN: [id number]    DOB: [birth year]   Date:08/14/2015       Progress Note  Subjective  Chief Complaint  Chief Complaint  Patient presents with  . Hypertension    HPI Here for f/u of HBP.  Taking meds.  Feelling well overall.  No problem-specific assessment & plan notes found for this encounter.   Past Medical History  Diagnosis Date  . Hypertension 12/25/2012  . History of adenomatous polyp of colon 12/25/2012    Past Surgical History  Procedure Laterality Date  . Knee arthroscopy w/ acl reconstruction  1998    ACL Reconstruction  . Osteotomy and ulnar shortening      Ulnar and Radial fracture  . Tonsillectomy Bilateral age 62    Family History  Problem Relation Age of Onset  . Hypertension Mother   . Hyperlipidemia Father   . Hypertension Father   . Colon cancer Brother   . Hyperlipidemia Maternal Grandfather   . Hyperlipidemia Paternal Grandmother   . Hyperlipidemia Paternal Grandfather     Social History   Social History  . Marital Status: Married    Spouse Name: N/A  . Number of Children: N/A  . Years of Education: N/A   Occupational History  . Not on file.   Social History Main Topics  . Smoking status: Former Games developer  . Smokeless tobacco: Former Neurosurgeon  . Alcohol Use: No  . Drug Use: No  . Sexual Activity: Not on file   Other Topics Concern  . Not on file   Social History Narrative     Current outpatient prescriptions:  .  Cholecalciferol (VITAMIN D PO), Take 5,000 Units by mouth daily. Reported on 04/10/2015, Disp: , Rfl:  .  Coenzyme Q10 (CO Q-10) 100 MG CAPS, Take 1 capsule by mouth daily., Disp: , Rfl:  .  diclofenac sodium (VOLTAREN) 1 % GEL, Apply 2 g topically 4 (four) times daily. Rub into affected area of foot 2 to 4 times daily, Disp: 100 g, Rfl: 2 .  fexofenadine (ALLEGRA) 180 MG tablet, Take 180 mg by mouth daily as needed., Disp: , Rfl:  .  KRILL OIL PO, Take by mouth. Reported on 04/10/2015, Disp: , Rfl:  .   lisinopril (PRINIVIL,ZESTRIL) 20 MG tablet, Take 1 tablet (20 mg total) by mouth daily., Disp: 90 tablet, Rfl: 3 .  metoprolol tartrate (LOPRESSOR) 25 MG tablet, Take 1 tablet (25 mg total) by mouth 2 (two) times daily., Disp: 180 tablet, Rfl: 3 .  Multiple Vitamin (MULTIVITAMIN) capsule, Take 1 capsule by mouth daily. Reported on 04/10/2015, Disp: , Rfl:  .  Testosterone (ANDROGEL PUMP) 20.25 MG/ACT (1.62%) GEL, Reported on 04/10/2015, Disp: , Rfl:   Not on File   Review of Systems  Constitutional: Negative for fever, chills, weight loss and malaise/fatigue.  HENT: Negative for hearing loss.   Eyes: Negative for blurred vision and double vision.  Respiratory: Negative for cough, shortness of breath and wheezing.   Cardiovascular: Negative for chest pain, palpitations and leg swelling.  Gastrointestinal: Negative for heartburn, abdominal pain and blood in stool.  Genitourinary: Negative for dysuria, urgency and frequency.  Musculoskeletal: Negative for myalgias and joint pain.  Skin: Negative for rash.  Neurological: Negative for dizziness, tremors, weakness and headaches.      Objective  Filed Vitals:   08/14/15 0811 08/14/15 0827  BP: 134/85 120/80  Pulse: 78   Temp: 98.2 F (36.8 C)   TempSrc: Oral   Resp:  16   Height: 5\' 7"  (1.702 m)   Weight: 213 lb (96.616 kg)     Physical Exam  Constitutional: He is oriented to person, place, and time and well-developed, well-nourished, and in no distress. No distress.  HENT:  Head: Normocephalic and atraumatic.  Eyes: Conjunctivae and EOM are normal. Pupils are equal, round, and reactive to light. No scleral icterus.  Neck: Normal range of motion. Neck supple. Carotid bruit is not present. No thyromegaly present.  Cardiovascular: Normal rate, regular rhythm and normal heart sounds.  Exam reveals no gallop and no friction rub.   No murmur heard. Pulmonary/Chest: Effort normal and breath sounds normal. No respiratory distress. He has  no wheezes. He has no rales.  Musculoskeletal: He exhibits no edema.  Lymphadenopathy:    He has no cervical adenopathy.  Neurological: He is alert and oriented to person, place, and time.  Vitals reviewed.      Recent Results (from the past 2160 hour(s))  Comprehensive Metabolic Panel (CMET)     Status: None   Collection Time: 06/24/15  9:05 AM  Result Value Ref Range   Glucose 91 65 - 99 mg/dL   BUN 13 6 - 24 mg/dL   Creatinine, Ser 1.610.94 0.76 - 1.27 mg/dL   GFR calc non Af Amer 93 >59 mL/min/1.73   GFR calc Af Amer 107 >59 mL/min/1.73   BUN/Creatinine Ratio 14 9 - 20   Sodium 141 134 - 144 mmol/L   Potassium 5.0 3.5 - 5.2 mmol/L   Chloride 99 96 - 106 mmol/L   CO2 26 18 - 29 mmol/L   Calcium 9.5 8.7 - 10.2 mg/dL   Total Protein 6.5 6.0 - 8.5 g/dL   Albumin 4.3 3.5 - 5.5 g/dL   Globulin, Total 2.2 1.5 - 4.5 g/dL   Albumin/Globulin Ratio 2.0 1.2 - 2.2   Bilirubin Total 0.4 0.0 - 1.2 mg/dL   Alkaline Phosphatase 57 39 - 117 IU/L   AST 22 0 - 40 IU/L   ALT 30 0 - 44 IU/L  Lipid Profile     Status: Abnormal   Collection Time: 06/24/15  9:05 AM  Result Value Ref Range   Cholesterol, Total 202 (H) 100 - 199 mg/dL   Triglycerides 096167 (H) 0 - 149 mg/dL   HDL 37 (L) >04>39 mg/dL   VLDL Cholesterol Cal 33 5 - 40 mg/dL   LDL Calculated 540132 (H) 0 - 99 mg/dL   Chol/HDL Ratio 5.5 (H) 0.0 - 5.0 ratio units    Comment:                                   T. Chol/HDL Ratio                                             Men  Women                               1/2 Avg.Risk  3.4    3.3                                   Avg.Risk  5.0    4.4  2X Avg.Risk  9.6    7.1                                3X Avg.Risk 23.4   11.0   CBC with Differential     Status: Abnormal   Collection Time: 06/24/15  9:05 AM  Result Value Ref Range   WBC 7.2 3.4 - 10.8 x10E3/uL   RBC 5.16 4.14 - 5.80 x10E6/uL   Hemoglobin 16.7 12.6 - 17.7 g/dL   Hematocrit 40.946.6 81.137.5 - 51.0 %   MCV  90 79 - 97 fL   MCH 32.4 26.6 - 33.0 pg   MCHC 35.8 (H) 31.5 - 35.7 g/dL   RDW 91.412.4 78.212.3 - 95.615.4 %   Platelets 276 150 - 379 x10E3/uL   Neutrophils 65 %   Lymphs 22 %   Monocytes 9 %   Eos 2 %   Basos 0 %   Neutrophils Absolute 4.7 1.4 - 7.0 x10E3/uL   Lymphocytes Absolute 1.6 0.7 - 3.1 x10E3/uL   Monocytes Absolute 0.6 0.1 - 0.9 x10E3/uL   EOS (ABSOLUTE) 0.1 0.0 - 0.4 x10E3/uL   Basophils Absolute 0.0 0.0 - 0.2 x10E3/uL   Immature Granulocytes 2 %   Immature Grans (Abs) 0.1 0.0 - 0.1 x10E3/uL  TSH     Status: None   Collection Time: 06/24/15  9:05 AM  Result Value Ref Range   TSH 2.740 0.450 - 4.500 uIU/mL     Assessment & Plan  Problem List Items Addressed This Visit      Cardiovascular and Mediastinum   Essential hypertension     Endocrine   Eunuchoidism     Other   Dyslipidemia   Obesity   RESOLVED: Lazy - Primary      No orders of the defined types were placed in this encounter.     2. Essential hypertension Cont Metoprolol and Lisinopril 3. Dyslipidemia Cont Krill oil. Decrease fats in diet  4. 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

## 2015-11-11 ENCOUNTER — Other Ambulatory Visit: Payer: Self-pay | Admitting: Family Medicine

## 2015-11-11 DIAGNOSIS — M25511 Pain in right shoulder: Secondary | ICD-10-CM

## 2016-02-23 ENCOUNTER — Encounter: Payer: Self-pay | Admitting: Family Medicine

## 2016-02-23 ENCOUNTER — Ambulatory Visit (INDEPENDENT_AMBULATORY_CARE_PROVIDER_SITE_OTHER): Payer: No Typology Code available for payment source | Admitting: Family Medicine

## 2016-02-23 VITALS — BP 130/85 | HR 79 | Temp 98.6°F | Resp 16 | Ht 67.0 in | Wt 202.0 lb

## 2016-02-23 DIAGNOSIS — E785 Hyperlipidemia, unspecified: Secondary | ICD-10-CM

## 2016-02-23 DIAGNOSIS — I10 Essential (primary) hypertension: Secondary | ICD-10-CM

## 2016-02-23 MED ORDER — METOPROLOL TARTRATE 25 MG PO TABS: 25.0000 mg | ORAL_TABLET | Freq: Two times a day (BID) | ORAL | 3 refills | Status: DC

## 2016-02-23 MED ORDER — LISINOPRIL 20 MG PO TABS: 20.0000 mg | ORAL_TABLET | Freq: Every day | ORAL | 3 refills | Status: DC

## 2016-02-23 NOTE — Progress Notes (Signed)
Name: John Mckinney   MRN: [id number]    DOB: 1965/10/24   Date:02/23/2016       Progress Note  Subjective  Chief Complaint  Chief Complaint  Patient presents with  . Hypertension  . dyslipidemia    HPI Here for f/u of HBP and elevated lipids.  He has low testosterone, but sees Urol Games developer) tomorrow.  Feeling well overall.   Needs labs checked.              No problem-specific Assessment & Plan notes found for this encounter.   Past Medical History:  Diagnosis Date  . History of adenomatous polyp of colon 12/25/2012  . Hypertension 12/25/2012    Past Surgical History:  Procedure Laterality Date  . KNEE ARTHROSCOPY W/ ACL RECONSTRUCTION  1998   ACL Reconstruction  . OSTEOTOMY AND ULNAR SHORTENING     Ulnar and Radial fracture  . TONSILLECTOMY Bilateral age 66    Family History  Problem Relation Age of Onset  . Hypertension Mother   . Hyperlipidemia Father   . Hypertension Father   . Colon cancer Brother   . Hyperlipidemia Maternal Grandfather   . Hyperlipidemia Paternal Grandmother   . Hyperlipidemia Paternal Grandfather     Social History   Social History  . Marital status: Married    Spouse name: N/A  . Number of children: N/A  . Years of education: N/A   Occupational History  . Not on file.   Social History Main Topics  . Smoking status: Former Games developer  . Smokeless tobacco: Former Neurosurgeon  . Alcohol use No  . Drug use: No  . Sexual activity: Not on file   Other Topics Concern  . Not on file   Social History Narrative  . No narrative on file     Current Outpatient Prescriptions:  .  Cholecalciferol (VITAMIN D PO), Take 5,000 Units by mouth daily. Reported on 04/10/2015, Disp: , Rfl:  .  Coenzyme Q10 (CO Q-10) 100 MG CAPS, Take 1 capsule by mouth daily., Disp: , Rfl:  .  Cream Base (Q-DERM EX), Take 81 mg by mouth daily., Disp: , Rfl:  .  diclofenac sodium (VOLTAREN) 1 % GEL, Apply 2 g topically 4 (four) times daily. Rub into affected  area of foot 2 to 4 times daily, Disp: 100 g, Rfl: 2 .  fexofenadine (ALLEGRA) 180 MG tablet, Take 180 mg by mouth daily as needed., Disp: , Rfl:  .  KRILL OIL PO, Take by mouth. Reported on 04/10/2015, Disp: , Rfl:  .  lisinopril (PRINIVIL,ZESTRIL) 20 MG tablet, Take 1 tablet (20 mg total) by mouth daily., Disp: 90 tablet, Rfl: 3 .  metoprolol tartrate (LOPRESSOR) 25 MG tablet, Take 1 tablet (25 mg total) by mouth 2 (two) times daily., Disp: 180 tablet, Rfl: 3 .  Multiple Vitamin (MULTIVITAMIN) capsule, Take 1 capsule by mouth daily. Reported on 04/10/2015, Disp: , Rfl:  .  Testosterone 30 MG/ACT SOLN, Apply 30 mg topically daily., Disp: , Rfl: 5  Not on File   Review of Systems  Constitutional: Positive for weight loss (desired). Negative for chills, fever and malaise/fatigue.  HENT: Negative for hearing loss and tinnitus.   Eyes: Negative for blurred vision and double vision.  Respiratory: Negative for cough, shortness of breath and wheezing.   Cardiovascular: Negative for chest pain, palpitations and PND.  Gastrointestinal: Negative for abdominal pain, blood in stool and heartburn.  Genitourinary: Negative for dysuria, frequency and urgency.  Musculoskeletal: Negative for  back pain and myalgias.  Skin: Negative for rash.  Neurological: Negative for dizziness, tingling, tremors, weakness and headaches.  Psychiatric/Behavioral: Negative for depression. The patient is not nervous/anxious and does not have insomnia.       Objective  Vitals:   02/23/16 0822 02/23/16 0854  BP: (!) 142/93 130/85  Pulse: 79   Resp: 16   Temp: 98.6 F (37 C)   TempSrc: Oral   Weight: 202 lb (91.6 kg)   Height: 5\' 7"  (1.702 m)     Physical Exam  Constitutional: He is oriented to person, place, and time and well-developed, well-nourished, and in no distress. No distress.  HENT:  Head: Normocephalic and atraumatic.  Eyes: Conjunctivae and EOM are normal. Pupils are equal, round, and reactive to  light. No scleral icterus.  Neck: Normal range of motion. Neck supple. Carotid bruit is not present. No thyromegaly present.  Cardiovascular: Normal rate, regular rhythm and normal heart sounds.  Exam reveals no gallop and no friction rub.   No murmur heard. Pulmonary/Chest: Effort normal and breath sounds normal. No respiratory distress. He has no wheezes. He has no rales.  Abdominal: Soft. Bowel sounds are normal. He exhibits no distension, no abdominal bruit and no mass. There is no tenderness.  Musculoskeletal: He exhibits no edema.  Lymphadenopathy:    He has no cervical adenopathy.  Neurological: He is alert and oriented to person, place, and time.  Vitals reviewed.      No results found for this or any previous visit (from the past 2160 hour(s)).   Assessment & Plan  Problem List Items Addressed This Visit      Cardiovascular and Mediastinum   Essential hypertension - Primary   Relevant Medications   metoprolol tartrate (LOPRESSOR) 25 MG tablet   lisinopril (PRINIVIL,ZESTRIL) 20 MG tablet   Other Relevant Orders   COMPLETE METABOLIC PANEL WITH GFR   CBC with Differential     Other   Dyslipidemia   Relevant Orders   Lipid Profile      Meds ordered this encounter  Medications  . Testosterone 30 MG/ACT SOLN    Sig: Apply 30 mg topically daily.    Refill:  5  . Cream Base (Q-DERM EX)    Sig: Take 81 mg by mouth daily.  . metoprolol tartrate (LOPRESSOR) 25 MG tablet    Sig: Take 1 tablet (25 mg total) by mouth 2 (two) times daily.    Dispense:  180 tablet    Refill:  3  . lisinopril (PRINIVIL,ZESTRIL) 20 MG tablet    Sig: Take 1 tablet (20 mg total) by mouth daily.    Dispense:  90 tablet    Refill:  3   1. Essential hypertension  - metoprolol tartrate (LOPRESSOR) 25 MG tablet; Take 1 tablet (25 mg total) by mouth 2 (two) times daily.  Dispense: 180 tablet; Refill: 3 - lisinopril (PRINIVIL,ZESTRIL) 20 MG tablet; Take 1 tablet (20 mg total) by mouth daily.   Dispense: 90 tablet; Refill: 3 - COMPLETE METABOLIC PANEL WITH GFR - CBC with Differential  2. Dyslipidemia  - Lipid Profile

## 2016-05-05 ENCOUNTER — Encounter: Payer: Self-pay | Admitting: Nurse Practitioner

## 2016-05-05 ENCOUNTER — Encounter: Payer: Self-pay | Admitting: *Deleted

## 2016-05-05 ENCOUNTER — Ambulatory Visit (INDEPENDENT_AMBULATORY_CARE_PROVIDER_SITE_OTHER): Payer: Self-pay | Admitting: Nurse Practitioner

## 2016-05-05 VITALS — BP 101/65 | HR 73 | Temp 98.2°F | Resp 16 | Ht 67.0 in | Wt 205.0 lb

## 2016-05-05 DIAGNOSIS — H6983 Other specified disorders of Eustachian tube, bilateral: Secondary | ICD-10-CM | POA: Diagnosis not present

## 2016-05-05 DIAGNOSIS — B369 Superficial mycosis, unspecified: Secondary | ICD-10-CM | POA: Diagnosis not present

## 2016-05-05 DIAGNOSIS — Z8669 Personal history of other diseases of the nervous system and sense organs: Secondary | ICD-10-CM

## 2016-05-05 DIAGNOSIS — H6243 Otitis externa in other diseases classified elsewhere, bilateral: Secondary | ICD-10-CM

## 2016-05-05 MED ORDER — FLUTICASONE PROPIONATE 50 MCG/ACT NA SUSP: 2.0000 | Freq: Every day | NASAL | 6 refills | Status: AC

## 2016-05-05 MED ORDER — CLOTRIMAZOLE 1 % EX SOLN: 1.0000 "application " | Freq: Two times a day (BID) | CUTANEOUS | 0 refills | Status: AC

## 2016-05-05 NOTE — Patient Instructions (Addendum)
Thank you for coming in to clinic today.  1. For your prior Middle Ear infection (Otitis Media):  - you have taken Amoxicillin in the last 7 days.  If you still have fluid sensation in your ears after another 5-7 days, let me know via phone call or myChart and we can retreat with Augmentin.  - Take Flonase 1 spray in each nostril for the next 14 days.  This should help reduce swelling and allow fluid to drain through your eustachian tubes. 2. For your External Ear infection (Otitis Externa):  - Place Clotrimazole drops in both ears twice a day for 14 days.  Leave drops in place for 5-10 minutes with a cotton ball or by lying on your side.   Please schedule a follow-up appointment as needed with Wilhelmina McardleLauren Loralyn Rachel, AGPCNP if symptoms persist greater than 10 days.    If you have any other questions or concerns, please feel free to call the clinic or send a message through MyChart. You may also schedule an earlier appointment if necessary.  Wilhelmina McardleLauren Breezie Micucci, DNP, AGPCNP-BC Nurse Practitioner [facility]    Otitis Media, Adult Otitis media is redness, soreness, and puffiness (swelling) in the space just behind your eardrum (middle ear). It may be caused by allergies or infection. It often happens along with a cold. Follow these instructions at home:  Take your medicine as told. Finish it even if you start to feel better.  Only take over-the-counter or prescription medicines for pain, discomfort, or fever as told by your doctor.  Follow up with your doctor as told. Contact a doctor if:  You have otitis media only in one ear, or bleeding from your nose, or both.  You notice a lump on your neck.  You are not getting better in 3-5 days.  You feel worse instead of better. Get help right away if:  You have pain that is not helped with medicine.  You have puffiness, redness, or pain around your ear.  You get a stiff neck.  You cannot move part of your face  (paralysis).  You notice that the bone behind your ear hurts when you touch it. This information is not intended to replace advice given to you by your health care provider. Make sure you discuss any questions you have with your health care provider. Document Released: 07/21/2007 Document Revised: 07/10/2015 Document Reviewed: 08/29/2012 Elsevier Interactive Patient Education  2017 [employer]    Otitis Externa Otitis externa is an infection of the outer ear canal. The outer ear canal is the area between the outside of the ear and the eardrum. Otitis externa is sometimes called "swimmer's ear." What are the causes? This condition may be caused by:  Swimming in dirty water.  Moisture in the ear.  An injury to the inside of the ear.  An object stuck in the ear.  A cut or scrape on the outside of the ear. What increases the risk? This condition is more likely to develop in swimmers. What are the signs or symptoms? The first symptom of this condition is often itching in the ear. Later signs and symptoms include:  Swelling of the ear.  Redness in the ear.  Ear pain. The pain may get worse when you pull on your ear.  Pus coming from the ear. How is this diagnosed? This condition may be diagnosed by examining the ear and testing fluid from the ear for bacteria and funguses. How is this treated? This condition may  be treated with:  Antibiotic ear drops. These are often given for 10-14 days.  Medicine to reduce itching and swelling. Follow these instructions at home:  If you were prescribed antibiotic ear drops, apply them as told by your health care provider. Do not stop using the antibiotic even if your condition improves.  Take over-the-counter and prescription medicines only as told by your health care provider.  Keep all follow-up visits as told by your health care provider. This is important. How is this prevented?  Keep your ear dry. Use the corner of a towel to  dry your ear after you swim or bathe.  Avoid scratching or putting things in your ear. Doing these things can damage the ear canal or remove the protective wax that lines it, which makes it easier for bacteria and funguses to grow.  Avoid swimming in lakes, polluted water, or pools that may not have the right amount of chlorine.  Consider making ear drops and putting 3 or 4 drops in each ear after you swim. Ask your health care provider about how you can make ear drops. Contact a health care provider if:  You have a fever.  After 3 days your ear is still red, swollen, painful, or draining pus.  Your redness, swelling, or pain gets worse.  You have a severe headache.  You have redness, swelling, pain, or tenderness in the area behind your ear. This information is not intended to replace advice given to you by your health care provider. Make sure you discuss any questions you have with your health care provider. Document Released: 02/01/2005 Document Revised: 03/11/2015 Document Reviewed: 11/11/2014 Elsevier Interactive Patient Education  2017 [employer].

## 2016-05-05 NOTE — Progress Notes (Signed)
I have reviewed this encounter including the documentation in this note and/or discussed this patient with the provider, Wilhelmina McardleLauren Kennedy, AGPCNP-BC. I am certifying that I agree with the content of this note as supervising physician.  Saralyn PilarAlexander Karamalegos, DO [facility] 05/05/2016, 4:31 PM

## 2016-05-05 NOTE — Progress Notes (Signed)
Subjective:    Patient ID: John Mckinney, male    DOB: 1966/01/31, 51 y.o.   MRN: [id number]  MILAN John Mckinney is a 51 y.o. male presenting on 05/05/2016 for Ear Pain (possible fluid)  HPI   Ear Pain Patient complains of fluid in his ears and ear pressure possible ear infection. He was previously seen by an employer associated telehealth visit 10 days ago and was started on a 5 day course of amoxicillin and 2 days later started Ciprodex for 7 days.  He has not noticed a change in his symptoms since taking these medicines.  Symptoms include plugged sensation in both ears that worsens when he leans his head forward.  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  Onset of symptoms was 1 month ago, and have been constant since that time. Associated symptoms include: transient headache and subjective fever, constant tinnitus, and notes "clicking" when he swallows. Patient denies: achiness, chills, congestion, non productive cough, post nasal drip, productive cough, sinus pressure, sneezing and sore throat.   Seasonal allergies  He takes claritin daily and attributes mild drainage with allergies.    Social History  Substance Use Topics  . Smoking status: Former Games developer  . Smokeless tobacco: Former Neurosurgeon  . Alcohol use No    Review of Systems Per HPI unless specifically indicated above     Objective:    BP 101/65 (BP Location: Left Arm, Patient Position: Sitting, Cuff Size: Large)   Pulse 73   Temp 98.2 F (36.8 C) (Oral)   Resp 16   Ht 5\' 7"  (1.702 m)   Wt 205 lb (93 kg)   BMI 32.11 kg/m   Wt Readings from Last 3 Encounters:  05/05/16 205 lb (93 kg)  02/23/16 202 lb (91.6 kg)  08/14/15 213 lb (96.6 kg)    Physical Exam  Constitutional: He is oriented to person, place, and time. He appears well-developed and well-nourished. No distress.  HENT:  Head:  Normocephalic and atraumatic.  Right Ear: Hearing and external ear normal. Tympanic membrane is retracted.  Left Ear: Hearing and external ear normal. Tympanic membrane is retracted.  Nose: Rhinorrhea present. Right sinus exhibits no maxillary sinus tenderness and no frontal sinus tenderness. Left sinus exhibits no maxillary sinus tenderness and no frontal sinus tenderness.  Mouth/Throat: Uvula is midline and mucous membranes are normal. Posterior oropharyngeal edema and posterior oropharyngeal erythema present. No oropharyngeal exudate or tonsillar abscesses.  Ear canal with diffuse edema, and fuzzy white spots consistent with fungal infection.  Neck: Normal range of motion. Neck supple. No tracheal deviation present.  Cardiovascular: Normal rate and regular rhythm.   Lymphadenopathy:    He has no cervical adenopathy.  Neurological: He is alert and oriented to person, place, and time.  Skin: Skin is warm and dry.  Psychiatric: He has a normal mood and affect. His behavior is normal. Judgment and thought content normal.       Assessment & Plan:   Problem List Items Addressed This Visit    None    Visit Diagnoses    Otitis externa, fungal, both ears    -  Primary   Relevant Medications   clotrimazole (LOTRIMIN) 1 % external solution  1. Apply clotrimazole drops twice per day for 14 days     Eustachian tube dysfunction, bilateral  1. Take flonase 2 sprays into both nostrils daily for 7-14 days.  Continue if symptoms improved and                   ongoing allergy response.  History of otitis media of both ears treated with amoxicillin in the past 60 days        1. No active infection noted.        2. Consider treatment with Augmentin if sensation of fluid in ears persists after treating eustachian            tube dysfunction.      Meds ordered this encounter  Medications  . clotrimazole (LOTRIMIN) 1 % external solution    Sig: Apply 1 application topically 2 (two) times  daily. Place drops in both ears.    Dispense:  28 mL    Refill:  0    Order Specific Question:   Supervising Provider    Answer:   Smitty CordsKARAMALEGOS, ALEXANDER J [[id number]]  . fluticasone (FLONASE) 50 MCG/ACT nasal spray    Sig: Place 2 sprays into both nostrils daily.    Dispense:  16 g    Refill:  6    Order Specific Question:   Supervising Provider    Answer:   Smitty CordsKARAMALEGOS, ALEXANDER J [[id number]]      Follow up plan: Follow up in 7-10 days as needed.   Wilhelmina McardleLauren Alyiah Ulloa, AGPCNP-BC [facility] 05/05/2016, 11:55 AM

## 2016-06-18 ENCOUNTER — Other Ambulatory Visit: Payer: Self-pay

## 2016-06-18 DIAGNOSIS — I10 Essential (primary) hypertension: Secondary | ICD-10-CM

## 2016-06-18 MED ORDER — LISINOPRIL 20 MG PO TABS: 20.0000 mg | ORAL_TABLET | Freq: Every day | ORAL | 3 refills | Status: DC

## 2016-06-18 NOTE — Telephone Encounter (Signed)
Last ov  05/05/16 Last filled 02/23/16

## 2016-10-20 ENCOUNTER — Other Ambulatory Visit: Payer: Self-pay

## 2016-10-20 DIAGNOSIS — I10 Essential (primary) hypertension: Secondary | ICD-10-CM

## 2016-10-22 MED ORDER — METOPROLOL TARTRATE 25 MG PO TABS: 25.0000 mg | ORAL_TABLET | Freq: Two times a day (BID) | ORAL | 0 refills | Status: DC

## 2016-11-16 ENCOUNTER — Other Ambulatory Visit: Payer: Self-pay | Admitting: Nurse Practitioner

## 2016-11-16 DIAGNOSIS — I10 Essential (primary) hypertension: Secondary | ICD-10-CM

## 2016-12-13 ENCOUNTER — Other Ambulatory Visit: Payer: Self-pay | Admitting: Nurse Practitioner

## 2016-12-13 DIAGNOSIS — I10 Essential (primary) hypertension: Secondary | ICD-10-CM

## 2017-03-09 ENCOUNTER — Other Ambulatory Visit: Payer: Self-pay | Admitting: Nurse Practitioner

## 2017-03-09 DIAGNOSIS — I10 Essential (primary) hypertension: Secondary | ICD-10-CM

## 2019-01-15 ENCOUNTER — Other Ambulatory Visit: Payer: Self-pay

## 2019-01-15 DIAGNOSIS — Z20822 Contact with and (suspected) exposure to covid-19: Secondary | ICD-10-CM

## 2019-01-16 LAB — NOVEL CORONAVIRUS, NAA: SARS-CoV-2, NAA: NOT DETECTED

## 2020-01-23 ENCOUNTER — Other Ambulatory Visit: Payer: Self-pay

## 2020-01-23 ENCOUNTER — Encounter: Payer: Self-pay | Admitting: Emergency Medicine

## 2020-01-23 ENCOUNTER — Emergency Department: Payer: PRIVATE HEALTH INSURANCE

## 2020-01-23 ENCOUNTER — Inpatient Hospital Stay
Admission: EM | Admit: 2020-01-23 | Discharge: 2020-01-25 | DRG: 193 | Disposition: A | Discharge status: Home / Self Care | Payer: PRIVATE HEALTH INSURANCE | Attending: Family Medicine | Admitting: Family Medicine

## 2020-01-23 DIAGNOSIS — D62 Acute posthemorrhagic anemia: Secondary | ICD-10-CM | POA: Diagnosis present

## 2020-01-23 DIAGNOSIS — F32A Depression, unspecified: Secondary | ICD-10-CM | POA: Diagnosis present

## 2020-01-23 DIAGNOSIS — J1000 Influenza due to other identified influenza virus with unspecified type of pneumonia: Principal | ICD-10-CM | POA: Diagnosis present

## 2020-01-23 DIAGNOSIS — N179 Acute kidney failure, unspecified: Secondary | ICD-10-CM | POA: Diagnosis present

## 2020-01-23 DIAGNOSIS — I10 Essential (primary) hypertension: Secondary | ICD-10-CM | POA: Diagnosis present

## 2020-01-23 DIAGNOSIS — Z79899 Other long term (current) drug therapy: Secondary | ICD-10-CM

## 2020-01-23 DIAGNOSIS — Z86010 Personal history of colonic polyps: Secondary | ICD-10-CM | POA: Diagnosis not present

## 2020-01-23 DIAGNOSIS — Z6833 Body mass index (BMI) 33.0-33.9, adult: Secondary | ICD-10-CM

## 2020-01-23 DIAGNOSIS — J918 Pleural effusion in other conditions classified elsewhere: Secondary | ICD-10-CM

## 2020-01-23 DIAGNOSIS — S20212A Contusion of left front wall of thorax, initial encounter: Secondary | ICD-10-CM | POA: Diagnosis present

## 2020-01-23 DIAGNOSIS — E785 Hyperlipidemia, unspecified: Secondary | ICD-10-CM | POA: Diagnosis present

## 2020-01-23 DIAGNOSIS — J45901 Unspecified asthma with (acute) exacerbation: Secondary | ICD-10-CM | POA: Diagnosis present

## 2020-01-23 DIAGNOSIS — J9601 Acute respiratory failure with hypoxia: Secondary | ICD-10-CM | POA: Diagnosis present

## 2020-01-23 DIAGNOSIS — J189 Pneumonia, unspecified organism: Secondary | ICD-10-CM

## 2020-01-23 DIAGNOSIS — J1100 Influenza due to unidentified influenza virus with unspecified type of pneumonia: Secondary | ICD-10-CM | POA: Diagnosis not present

## 2020-01-23 DIAGNOSIS — R059 Cough, unspecified: Secondary | ICD-10-CM

## 2020-01-23 DIAGNOSIS — E860 Dehydration: Secondary | ICD-10-CM | POA: Diagnosis present

## 2020-01-23 DIAGNOSIS — Z87891 Personal history of nicotine dependence: Secondary | ICD-10-CM

## 2020-01-23 DIAGNOSIS — Z800 Family history of malignant neoplasm of digestive organs: Secondary | ICD-10-CM | POA: Diagnosis not present

## 2020-01-23 DIAGNOSIS — E669 Obesity, unspecified: Secondary | ICD-10-CM | POA: Diagnosis present

## 2020-01-23 DIAGNOSIS — M940 Chondrocostal junction syndrome [Tietze]: Secondary | ICD-10-CM | POA: Diagnosis present

## 2020-01-23 DIAGNOSIS — J90 Pleural effusion, not elsewhere classified: Secondary | ICD-10-CM | POA: Diagnosis present

## 2020-01-23 DIAGNOSIS — J101 Influenza due to other identified influenza virus with other respiratory manifestations: Secondary | ICD-10-CM

## 2020-01-23 DIAGNOSIS — Z8249 Family history of ischemic heart disease and other diseases of the circulatory system: Secondary | ICD-10-CM | POA: Diagnosis not present

## 2020-01-23 DIAGNOSIS — I700 Atherosclerosis of aorta: Secondary | ICD-10-CM | POA: Diagnosis present

## 2020-01-23 DIAGNOSIS — J4541 Moderate persistent asthma with (acute) exacerbation: Secondary | ICD-10-CM

## 2020-01-23 DIAGNOSIS — Z20822 Contact with and (suspected) exposure to covid-19: Secondary | ICD-10-CM | POA: Diagnosis present

## 2020-01-23 DIAGNOSIS — S20219A Contusion of unspecified front wall of thorax, initial encounter: Secondary | ICD-10-CM

## 2020-01-23 DIAGNOSIS — Z83438 Family history of other disorder of lipoprotein metabolism and other lipidemia: Secondary | ICD-10-CM | POA: Diagnosis not present

## 2020-01-23 DIAGNOSIS — I959 Hypotension, unspecified: Secondary | ICD-10-CM | POA: Diagnosis present

## 2020-01-23 DIAGNOSIS — I493 Ventricular premature depolarization: Secondary | ICD-10-CM | POA: Diagnosis present

## 2020-01-23 DIAGNOSIS — O85 Puerperal sepsis: Secondary | ICD-10-CM | POA: Insufficient documentation

## 2020-01-23 HISTORY — DX: Unspecified asthma, uncomplicated: J45.909

## 2020-01-23 LAB — TYPE AND SCREEN
ABO/RH(D): A POS
Antibody Screen: NEGATIVE

## 2020-01-23 LAB — BASIC METABOLIC PANEL
Anion gap: 12 (ref 5–15)
Anion gap: 9 (ref 5–15)
BUN: 29 mg/dL — ABNORMAL HIGH (ref 6–20)
BUN: 30 mg/dL — ABNORMAL HIGH (ref 6–20)
CO2: 24 mmol/L (ref 22–32)
CO2: 25 mmol/L (ref 22–32)
Calcium: 8.9 mg/dL (ref 8.9–10.3)
Calcium: 8.4 mg/dL — ABNORMAL LOW (ref 8.9–10.3)
Chloride: 101 mmol/L (ref 98–111)
Chloride: 105 mmol/L (ref 98–111)
Creatinine, Ser: 1.5 mg/dL — ABNORMAL HIGH (ref 0.61–1.24)
Creatinine, Ser: 1.05 mg/dL (ref 0.61–1.24)
GFR, Estimated: 54 mL/min — ABNORMAL LOW (ref >60)
GFR, Estimated: >60 mL/min (ref >60)
Glucose, Bld: 150 mg/dL — ABNORMAL HIGH (ref 70–99)
Glucose, Bld: 125 mg/dL — ABNORMAL HIGH (ref 70–99)
Potassium: 3.9 mmol/L (ref 3.5–5.1)
Potassium: 4.4 mmol/L (ref 3.5–5.1)
Sodium: 137 mmol/L (ref 135–145)
Sodium: 139 mmol/L (ref 135–145)

## 2020-01-23 LAB — STREP PNEUMONIAE URINARY ANTIGEN: Strep Pneumo Urinary Antigen: NEGATIVE

## 2020-01-23 LAB — CBC WITH DIFFERENTIAL/PLATELET
Abs Immature Granulocytes: 0.11 10*3/uL — ABNORMAL HIGH (ref 0.00–0.07)
Basophils Absolute: 0 10*3/uL (ref 0.0–0.1)
Basophils Relative: 0 %
Eosinophils Absolute: 0.2 10*3/uL (ref 0.0–0.5)
Eosinophils Relative: 2 %
HCT: 40.7 % (ref 39.0–52.0)
Hemoglobin: 14 g/dL (ref 13.0–17.0)
Immature Granulocytes: 1 %
Lymphocytes Relative: 24 %
Lymphs Abs: 2.5 10*3/uL (ref 0.7–4.0)
MCH: 33.3 pg (ref 26.0–34.0)
MCHC: 34.4 g/dL (ref 30.0–36.0)
MCV: 96.7 fL (ref 80.0–100.0)
Monocytes Absolute: 0.8 10*3/uL (ref 0.1–1.0)
Monocytes Relative: 7 %
Neutro Abs: 7.1 10*3/uL (ref 1.7–7.7)
Neutrophils Relative %: 66 %
Platelets: 246 10*3/uL (ref 150–400)
RBC: 4.21 MIL/uL — ABNORMAL LOW (ref 4.22–5.81)
RDW: 11.9 % (ref 11.5–15.5)
WBC: 10.8 10*3/uL — ABNORMAL HIGH (ref 4.0–10.5)
nRBC: 0 % (ref 0.0–0.2)

## 2020-01-23 LAB — TROPONIN I (HIGH SENSITIVITY)
Troponin I (High Sensitivity): 5 ng/L (ref <18)
Troponin I (High Sensitivity): 4 ng/L (ref <18)

## 2020-01-23 LAB — RESP PANEL BY RT-PCR (FLU A&B, COVID) ARPGX2
Influenza A by PCR: POSITIVE — AB
Influenza B by PCR: NEGATIVE
SARS Coronavirus 2 by RT PCR: NEGATIVE

## 2020-01-23 LAB — CBC
HCT: 38.2 % — ABNORMAL LOW (ref 39.0–52.0)
Hemoglobin: 13 g/dL (ref 13.0–17.0)
MCH: 33 pg (ref 26.0–34.0)
MCHC: 34 g/dL (ref 30.0–36.0)
MCV: 97 fL (ref 80.0–100.0)
Platelets: 152 10*3/uL (ref 150–400)
RBC: 3.94 MIL/uL — ABNORMAL LOW (ref 4.22–5.81)
RDW: 11.9 % (ref 11.5–15.5)
WBC: 11.7 10*3/uL — ABNORMAL HIGH (ref 4.0–10.5)
nRBC: 0 % (ref 0.0–0.2)

## 2020-01-23 LAB — HIV ANTIBODY (ROUTINE TESTING W REFLEX): HIV Screen 4th Generation wRfx: NONREACTIVE

## 2020-01-23 LAB — PROCALCITONIN: Procalcitonin: <0.1 ng/mL

## 2020-01-23 MED ORDER — METOPROLOL TARTRATE 25 MG PO TABS
25.0000 mg | ORAL_TABLET | Freq: Two times a day (BID) | ORAL | Status: DC
  Administered 2020-01-23 – 2020-01-24 (×2): 25 mg via ORAL
  Filled 2020-01-23 (×2): qty 1

## 2020-01-23 MED ORDER — METHYLPREDNISOLONE SODIUM SUCC 125 MG IJ SOLR
125.0000 mg | Freq: Once | INTRAMUSCULAR | Status: AC
  Administered 2020-01-23: 125 mg via INTRAVENOUS
  Filled 2020-01-23: qty 2

## 2020-01-23 MED ORDER — MAGNESIUM HYDROXIDE 400 MG/5ML PO SUSP
30.0000 mL | Freq: Every day | ORAL | Status: DC | PRN
  Filled 2020-01-23: qty 30

## 2020-01-23 MED ORDER — OSELTAMIVIR PHOSPHATE 75 MG PO CAPS
75.0000 mg | ORAL_CAPSULE | Freq: Two times a day (BID) | ORAL | Status: DC
  Filled 2020-01-23: qty 1

## 2020-01-23 MED ORDER — METHYLPREDNISOLONE SODIUM SUCC 40 MG IJ SOLR
40.0000 mg | Freq: Four times a day (QID) | INTRAMUSCULAR | Status: AC
  Administered 2020-01-23 (×4): 40 mg via INTRAVENOUS
  Filled 2020-01-23 (×4): qty 1

## 2020-01-23 MED ORDER — ONDANSETRON HCL 4 MG PO TABS: 4.0000 mg | ORAL_TABLET | Freq: Four times a day (QID) | ORAL | Status: DC | PRN

## 2020-01-23 MED ORDER — TRAZODONE HCL 50 MG PO TABS
25.0000 mg | ORAL_TABLET | Freq: Every evening | ORAL | Status: DC | PRN
  Administered 2020-01-24: 25 mg via ORAL
  Filled 2020-01-23: qty 1

## 2020-01-23 MED ORDER — PREDNISONE 20 MG PO TABS
40.0000 mg | ORAL_TABLET | Freq: Every day | ORAL | Status: DC
  Administered 2020-01-24 – 2020-01-25 (×2): 40 mg via ORAL
  Filled 2020-01-23 (×2): qty 2

## 2020-01-23 MED ORDER — ACETAMINOPHEN 325 MG PO TABS: 650.0000 mg | ORAL_TABLET | Freq: Four times a day (QID) | ORAL | Status: DC | PRN

## 2020-01-23 MED ORDER — ENOXAPARIN SODIUM 40 MG/0.4ML ~~LOC~~ SOLN
40.0000 mg | SUBCUTANEOUS | Status: DC
  Administered 2020-01-23 – 2020-01-25 (×3): 40 mg via SUBCUTANEOUS
  Filled 2020-01-23 (×4): qty 0.4

## 2020-01-23 MED ORDER — KETOROLAC TROMETHAMINE 15 MG/ML IJ SOLN
15.0000 mg | Freq: Four times a day (QID) | INTRAMUSCULAR | Status: DC | PRN
  Filled 2020-01-23 (×2): qty 1

## 2020-01-23 MED ORDER — SODIUM CHLORIDE 0.9 % IV SOLN: 2.0000 g | INTRAVENOUS | Status: DC

## 2020-01-23 MED ORDER — SODIUM CHLORIDE 0.9 % IV SOLN: INTRAVENOUS | Status: DC

## 2020-01-23 MED ORDER — ONDANSETRON HCL 4 MG/2ML IJ SOLN
4.0000 mg | Freq: Four times a day (QID) | INTRAMUSCULAR | Status: DC | PRN
  Administered 2020-01-23: 4 mg via INTRAVENOUS
  Filled 2020-01-23: qty 2

## 2020-01-23 MED ORDER — LORATADINE 10 MG PO TABS
10.0000 mg | ORAL_TABLET | Freq: Every day | ORAL | Status: DC
  Administered 2020-01-23 – 2020-01-25 (×3): 10 mg via ORAL
  Filled 2020-01-23 (×3): qty 1

## 2020-01-23 MED ORDER — IOHEXOL 350 MG/ML SOLN
100.0000 mL | Freq: Once | INTRAVENOUS | Status: AC | PRN
  Administered 2020-01-23: 100 mL via INTRAVENOUS

## 2020-01-23 MED ORDER — OSELTAMIVIR PHOSPHATE 75 MG PO CAPS
75.0000 mg | ORAL_CAPSULE | Freq: Once | ORAL | Status: AC
  Administered 2020-01-23: 75 mg via ORAL
  Filled 2020-01-23: qty 1

## 2020-01-23 MED ORDER — Q-DERM EX CREA: TOPICAL_CREAM | Freq: Every day | CUTANEOUS | Status: DC

## 2020-01-23 MED ORDER — ADULT MULTIVITAMIN W/MINERALS CH
1.0000 | ORAL_TABLET | Freq: Every day | ORAL | Status: DC
  Administered 2020-01-23 – 2020-01-25 (×3): 1 via ORAL
  Filled 2020-01-23 (×3): qty 1

## 2020-01-23 MED ORDER — TESTOSTERONE CYPIONATE 200 MG/ML IM SOLN: 200.0000 mg | INTRAMUSCULAR | Status: DC

## 2020-01-23 MED ORDER — HYDROCOD POLST-CPM POLST ER 10-8 MG/5ML PO SUER
5.0000 mL | Freq: Two times a day (BID) | ORAL | Status: DC | PRN
  Administered 2020-01-23 – 2020-01-24 (×2): 5 mL via ORAL
  Filled 2020-01-23 (×2): qty 5

## 2020-01-23 MED ORDER — MAGNESIUM SULFATE 2 GM/50ML IV SOLN
2.0000 g | Freq: Once | INTRAVENOUS | Status: AC
  Administered 2020-01-23: 2 g via INTRAVENOUS
  Filled 2020-01-23: qty 50

## 2020-01-23 MED ORDER — FLUTICASONE PROPIONATE 50 MCG/ACT NA SUSP
2.0000 | Freq: Every day | NASAL | Status: DC
  Administered 2020-01-25: 10:00:00 2 via NASAL
  Filled 2020-01-23: qty 16

## 2020-01-23 MED ORDER — CO Q-10 100 MG PO CAPS: 1.0000 | ORAL_CAPSULE | Freq: Every day | ORAL | Status: DC

## 2020-01-23 MED ORDER — AZITHROMYCIN 500 MG PO TABS
500.0000 mg | ORAL_TABLET | Freq: Once | ORAL | Status: AC
  Administered 2020-01-23: 500 mg via ORAL
  Filled 2020-01-23: qty 1

## 2020-01-23 MED ORDER — SODIUM CHLORIDE 0.9 % IV BOLUS
1000.0000 mL | Freq: Once | INTRAVENOUS | Status: AC
  Administered 2020-01-23: 1000 mL via INTRAVENOUS

## 2020-01-23 MED ORDER — ACETAMINOPHEN 650 MG RE SUPP: 650.0000 mg | Freq: Four times a day (QID) | RECTAL | Status: DC | PRN

## 2020-01-23 MED ORDER — IPRATROPIUM-ALBUTEROL 0.5-2.5 (3) MG/3ML IN SOLN
3.0000 mL | Freq: Once | RESPIRATORY_TRACT | Status: AC
  Administered 2020-01-23: 3 mL via RESPIRATORY_TRACT
  Filled 2020-01-23: qty 3

## 2020-01-23 MED ORDER — FENTANYL CITRATE (PF) 100 MCG/2ML IJ SOLN
50.0000 ug | Freq: Once | INTRAMUSCULAR | Status: AC
  Administered 2020-01-23: 50 ug via INTRAVENOUS
  Filled 2020-01-23: qty 2

## 2020-01-23 MED ORDER — SODIUM CHLORIDE 0.9 % IV SOLN: 500.0000 mg | INTRAVENOUS | Status: DC

## 2020-01-23 MED ORDER — OXYCODONE-ACETAMINOPHEN 5-325 MG PO TABS
1.0000 | ORAL_TABLET | ORAL | Status: DC | PRN
  Administered 2020-01-23 – 2020-01-25 (×8): 2 via ORAL
  Filled 2020-01-23 (×8): qty 2

## 2020-01-23 MED ORDER — LACTATED RINGERS IV BOLUS
1000.0000 mL | Freq: Once | INTRAVENOUS | Status: AC
  Administered 2020-01-23: 1000 mL via INTRAVENOUS

## 2020-01-23 MED ORDER — ONDANSETRON HCL 4 MG/2ML IJ SOLN
4.0000 mg | Freq: Once | INTRAMUSCULAR | Status: AC
  Administered 2020-01-23: 4 mg via INTRAVENOUS
  Filled 2020-01-23: qty 2

## 2020-01-23 MED ORDER — LACTATED RINGERS IV BOLUS: 1000.0000 mL | Freq: Once | INTRAVENOUS | Status: DC

## 2020-01-23 MED ORDER — MORPHINE SULFATE (PF) 2 MG/ML IV SOLN
2.0000 mg | INTRAVENOUS | Status: DC | PRN
  Administered 2020-01-23 – 2020-01-25 (×7): 2 mg via INTRAVENOUS
  Filled 2020-01-23 (×8): qty 1

## 2020-01-23 MED ORDER — ALBUTEROL SULFATE (2.5 MG/3ML) 0.083% IN NEBU
10.0000 mg | INHALATION_SOLUTION | Freq: Once | RESPIRATORY_TRACT | Status: AC
  Administered 2020-01-23: 10 mg via RESPIRATORY_TRACT
  Filled 2020-01-23: qty 12

## 2020-01-23 MED ORDER — TESTOSTERONE 30 MG/ACT TD SOLN: 30.0000 mg | Freq: Every day | TRANSDERMAL | Status: DC

## 2020-01-23 MED ORDER — SODIUM CHLORIDE 0.9 % IV SOLN
1.0000 g | Freq: Once | INTRAVENOUS | Status: AC
  Administered 2020-01-23: 1 g via INTRAVENOUS
  Filled 2020-01-23: qty 10

## 2020-01-23 MED ORDER — GUAIFENESIN ER 600 MG PO TB12
600.0000 mg | ORAL_TABLET | Freq: Two times a day (BID) | ORAL | Status: DC
  Administered 2020-01-23 – 2020-01-25 (×5): 600 mg via ORAL
  Filled 2020-01-23 (×5): qty 1

## 2020-01-23 MED ORDER — IPRATROPIUM-ALBUTEROL 0.5-2.5 (3) MG/3ML IN SOLN
3.0000 mL | Freq: Four times a day (QID) | RESPIRATORY_TRACT | Status: DC
  Administered 2020-01-23 – 2020-01-25 (×10): 3 mL via RESPIRATORY_TRACT
  Filled 2020-01-23 (×3): qty 3
  Filled 2020-01-23: qty 6
  Filled 2020-01-23 (×6): qty 3

## 2020-01-23 NOTE — ED Notes (Addendum)
This RN at bedside. Pt requesting pain medication. This RN stating pt needs updated BP and heart rhythm to ensure it is ok to give pain medication.

## 2020-01-23 NOTE — ED Notes (Addendum)
Receiving RN messaged about handoff 

## 2020-01-23 NOTE — ED Notes (Addendum)
Another RN at bedside due to this RN being in critical pt's room. Pt requesting pain medication. Staff explained Primary RN unable to get into room at this time

## 2020-01-23 NOTE — ED Notes (Signed)
Room still not marked as clean

## 2020-01-23 NOTE — H&P (Signed)
[place]   PATIENT NAME: John Mckinney    MR#:  [id number]  DATE OF BIRTH:  1965-03-04  DATE OF ADMISSION:  01/23/2020  PRIMARY CARE PHYSICIAN: Jerl Mina, MD   REQUESTING/REFERRING PHYSICIAN: Nita Sickle, MD  CHIEF COMPLAINT:   Chief Complaint  Patient presents with  . Chest Pain  . Shortness of Breath    HISTORY OF PRESENT ILLNESS:  John Mckinney  is a 54 y.o. Caucasian male with a known history of asthma and hypertension, who presented to the emergency room with acute onset of worsening cough with associated dyspnea and wheezing which has been going on for the last week.  He tested negative for Covid on an outpatient basis as well as his wife..  He stated that his cough was significant enough that he heard something pop in his left chest.  He had an episode of vomiting with associated diaphoresis and pallor.  His pain was extending to the left upper quadrant and to the left back with mild swelling.  With his vomiting, he dropped his pulse oximetry to 88% on room air as well as his systolic blood pressure to 64.  He denied any fever or chills at home but has been taking ibuprofen.  No diarrhea or melena or bright red bleeding per rectum.  No bilious vomitus or hematemesis.  No dysuria, oliguria or hematuria or flank pain.  Upon presentation to the emergency room, respiratory rate was 30 with otherwise normal vital signs.  BMP showed blood glucose of 150 and a creatinine of 1.5 with a BUN of 29.  Procalcitonin was less than 0.1 CBC showed a leukocytosis of 10.8.  COVID-19 PCR came back negative.  Influenza antigen A came back positive and B was negative.  Chest x-ray showed small left pleural effusion with adjacent streaky airspace opacity that could be related to atelectasis and/or infectious etiology.  Chest CTA revealed small left pleural effusion with adjacent patchy airspace opacity of the left lung base that could be due to pneumonia, subcutaneous and muscular edema  seen along the left lower chest, mild right pelviectasis with med ureteral calculi, hepatic steatosis, mild aortic atherosclerosis and no acute aortic abnormality.  The patient was given IV Rocephin and Zithromax, 1 L bolus of IV normal saline and 1 L bolus of IV lactated ringer, 2 g of IV magnesium sulfate, IV fentanyl, duo nebs x3 and then continuous albuterol nebulizer and p.o. Tamiflu.  She will be admitted to a progressive unit bed for further evaluation and management.  PAST MEDICAL HISTORY:   Past Medical History:  Diagnosis Date  . Asthma   . History of adenomatous polyp of colon 12/25/2012  . Hypertension 12/25/2012    PAST SURGICAL HISTORY:   Past Surgical History:  Procedure Laterality Date  . KNEE ARTHROSCOPY W/ ACL RECONSTRUCTION  1998   ACL Reconstruction  . OSTEOTOMY AND ULNAR SHORTENING     Ulnar and Radial fracture  . TONSILLECTOMY Bilateral age 54    SOCIAL HISTORY:   Social History   Tobacco Use  . Smoking status: Former Games developer  . Smokeless tobacco: Former Engineer, water Use Topics  . Alcohol use: No    Alcohol/week: 0.0 standard drinks    FAMILY HISTORY:   Family History  Problem Relation Age of Onset  . Hypertension Mother   . Hyperlipidemia Father   . Hypertension Father   . Colon cancer Brother   . Hyperlipidemia Maternal Grandfather   . Hyperlipidemia Paternal Grandmother   .  Hyperlipidemia Paternal Grandfather     DRUG ALLERGIES:  No Known Allergies  REVIEW OF SYSTEMS:   ROS As per history of present illness. All pertinent systems were reviewed above. Constitutional, HEENT, cardiovascular, respiratory, GI, GU, musculoskeletal, neuro, psychiatric, endocrine, integumentary and hematologic systems were reviewed and are otherwise negative/unremarkable except for positive findings mentioned above in the HPI.   MEDICATIONS AT HOME:   Prior to Admission medications   Medication Sig Start Date End Date Taking? Authorizing Provider   Cholecalciferol (VITAMIN D PO) Take 5,000 Units by mouth daily. Reported on 04/10/2015    [provider]  CIPRODEX otic suspension Place 4 drops into both ears as needed. 04/28/16   [provider]  Coenzyme Q10 (CO Q-10) 100 MG CAPS Take 1 capsule by mouth daily.    [provider]  Cream Base (Q-DERM EX) Take 81 mg by mouth daily.    [provider]  diclofenac sodium (VOLTAREN) 1 % GEL Apply 2 g topically 4 (four) times daily. Rub into affected area of foot 2 to 4 times daily 04/02/15   Vivi Barrack, DPM  fexofenadine (ALLEGRA) 180 MG tablet Take 180 mg by mouth daily as needed.    [provider]  fluticasone (FLONASE) 50 MCG/ACT nasal spray Place 2 sprays into both nostrils daily. 05/05/16   Galen Manila, NP  KRILL OIL PO Take by mouth. Reported on 04/10/2015    [provider]  lisinopril (PRINIVIL,ZESTRIL) 20 MG tablet Take 1 tablet (20 mg total) by mouth daily. 06/18/16   Karamalegos, Netta Neat, DO  metoprolol tartrate (LOPRESSOR) 25 MG tablet TAKE 1 TABLET BY MOUTH TWICE DAILY 03/09/17   Galen Manila, NP  Multiple Vitamin (MULTIVITAMIN) capsule Take 1 capsule by mouth daily. Reported on 04/10/2015    [provider]  Testosterone 30 MG/ACT SOLN Apply 30 mg topically daily. 02/10/16   [provider]  testosterone cypionate (DEPOTESTOSTERONE CYPIONATE) 200 MG/ML injection Inject 200 mg into the muscle as directed. 04/22/16   [provider]      VITAL SIGNS:  Blood pressure 105/65, pulse 92, temperature 98.5 F (36.9 C), temperature source Oral, resp. rate 14, height 5\' 6"  (1.676 m), weight 95.3 kg, SpO2 99 %.  PHYSICAL EXAMINATION:  Physical Exam  GENERAL:  54 y.o.-year-old patient lying in the bed with mild respiratory distress with conversational dyspnea. EYES: Pupils equal, round, reactive to light and accommodation. No scleral icterus. Extraocular muscles intact.  HEENT: Head  atraumatic, normocephalic. Oropharynx and nasopharynx clear.  NECK:  Supple, no jugular venous distention. No thyroid enlargement, no tenderness.  LUNGS: Diminished left basal and midlung zone breath sounds.  Left lateral posterior chest wall swelling with tenderness CARDIOVASCULAR: Regular rate and rhythm, S1, S2 normal. No murmurs, rubs, or gallops.  ABDOMEN: Soft, nondistended, nontender. Bowel sounds present. No organomegaly or mass.  EXTREMITIES: No pedal edema, cyanosis, or clubbing.  NEUROLOGIC: Cranial nerves II through XII are intact. Muscle strength 5/5 in all extremities. Sensation intact. Gait not checked.  PSYCHIATRIC: The patient is alert and oriented x 3.  Normal affect and good eye contact. SKIN: No obvious rash, lesion, or ulcer.   LABORATORY PANEL:   CBC Recent Labs  Lab 01/23/20 0212  WBC 10.8*  HGB 14.0  HCT 40.7  PLT 246   ------------------------------------------------------------------------------------------------------------------  Chemistries  Recent Labs  Lab 01/23/20 0208  NA 137  K 3.9  CL 101  CO2 24  GLUCOSE 150*  BUN 29*  CREATININE 1.50*  CALCIUM 8.9   ------------------------------------------------------------------------------------------------------------------  Cardiac Enzymes No results for input(s): TROPONINI in the last 168 hours. ------------------------------------------------------------------------------------------------------------------  RADIOLOGY:  DG Chest Port 1 View  Result Date: 01/23/2020 CLINICAL DATA:  Left-sided chest EXAM: PORTABLE CHEST 1 VIEW COMPARISON:  None. FINDINGS: The heart size and mediastinal contours are within normal limits. Streaky airspace opacity seen at the left lung base. There appears to be blunting of the left costophrenic angle which could be a small left pleural effusion. The visualized skeletal structures are unremarkable. IMPRESSION: Small left pleural effusion with adjacent streaky  airspace opacity which could be due to atelectasis and/or infectious etiology. Electronically Signed   By: Jonna ClarkBindu  Avutu M.D.   On: 01/23/2020 02:36   CT Angio Chest/Abd/Pel for Dissection W and/or W/WO  Result Date: 01/23/2020 CLINICAL DATA:  Left chest wall pain EXAM: CT ANGIOGRAPHY CHEST, ABDOMEN AND PELVIS TECHNIQUE: Non-contrast CT of the chest was initially obtained. Multidetector CT imaging through the chest, abdomen and pelvis was performed using the standard protocol during bolus administration of intravenous contrast. Multiplanar reconstructed images and MIPs were obtained and reviewed to evaluate the vascular anatomy. CONTRAST:  100mL OMNIPAQUE IOHEXOL 350 MG/ML SOLN COMPARISON:  Radiograph same day FINDINGS: CTA CHEST FINDINGS Cardiovascular: --Heart: The heart size is normal.  There is nopericardial effusion. --Aorta: The course and caliber of the thoracic aorta are normal. There is no aortic atherosclerotic calcification. Precontrast images show no aortic intramural hematoma. There is no blood pool, dissection or penetrating ulcer demonstrated on arterial phase postcontrast imaging. There is a conventional 3 vessel aortic arch branching pattern. The proximal arch vessels are widely patent. --Pulmonary Arteries: Contrast timing is optimized for preferential opacification of the aorta. Within that limitation, normal central pulmonary arteries. Mediastinum/Nodes: No mediastinal, hilar or axillary lymphadenopathy. The visualized thyroid and thoracic esophageal course are unremarkable. Lungs/Pleura: There is a small left pleural effusion present. Patchy airspace opacity is seen at the left lung base. The right lung is clear. Musculoskeletal: There is subcutaneous edema with edema in the external oblique musculature overlying the left lower chest wall. Review of the MIP images confirms the above findings. CTA ABDOMEN AND PELVIS FINDINGS VASCULAR Aorta: Normal caliber aorta without aneurysm, dissection,  vasculitis or hemodynamically significant stenosis. There is scattered mild aortic atherosclerosis. Celiac: No aneurysm, dissection or hemodynamically significant stenosis. Normal branching pattern SMA: Widely patent without dissection or stenosis. Renals: Single renal arteries bilaterally. No aneurysm, dissection, stenosis or evidence of fibromuscular dysplasia. IMA: Patent without abnormality. Inflow: No aneurysm, stenosis or dissection. Veins: Normal course and caliber of the major veins. Assessment is otherwise limited by the arterial dominant contrast phase. Review of the MIP images confirms the above findings. NON-VASCULAR Hepatobiliary: There is diffuse low density seen throughout the liver parenchyma. A peripherally enhancing lesion seen within the right liver lobe measuring 3.8 cm which is likely hemangioma. No visible biliary dilatation. Normal gallbladder. Pancreas: Normal contours without ductal dilatation. No peripancreatic fluid collection. Spleen: There is a 2 cm low-density lesion seen within the spleen. Adrenals/Urinary Tract: --Adrenal glands: Normal. --kidneys: There is mild right pelviectasis with small calcifications seen within the mid ureter the largest measuring 4 mm. There is also punctate calcifications seen in the upper pole of the right kidney. The left kidney is unremarkable. --Urinary bladder: Unremarkable. Stomach/Bowel: --Stomach/Duodenum: No hiatal hernia or other gastric abnormality. Normal duodenal course and caliber. --Small bowel: No dilatation or inflammation. --Colon: No focal abnormality. --Appendix: Normal. Lymphatic:  No abdominal or pelvic lymphadenopathy. Reproductive: No free fluid  in the pelvis. Calcifications seen within the prostate gland. Musculoskeletal. No bony spinal canal stenosis or focal osseous abnormality. Other: Small fat containing inguinal hernias are present. Review of the MIP images confirms the above findings. IMPRESSION: 1. No acute aortic abnormality.  2. Mild aortic atherosclerosis. Aortic Atherosclerosis (ICD10-I70.0). 3. Small left pleural effusion with adjacent patchy airspace opacity at the left lung base which could be due to pneumonia. 4. Subcutaneous and muscular edema seen along the left lower chest wall. 5. Mild right pelviectasis with mid ureteral calculi. 6. Hepatic steatosis Electronically Signed   By: Jonna Clark M.D.   On: 01/23/2020 03:48      IMPRESSION AND PLAN:   1.  Influenza pneumonia with associated left pleural effusion and associated left-sided atypical left sided chest likely pleuritic pain as well as chest wall pain due to subcutaneous and muscular edema.  He has subsequent acute hypoxic respiratory failure -The patient will be admitted to a progressive unit bed. -We will continue him on p.o. Tamiflu and hold off on further antibiotics given procalcitonin of less than 0.1 and awaiting blood and sputum culture. -O2 protocol will be followed.  2.  Asthma exacerbation secondary to #1. -We will continue steroid therapy with IV Solu-Medrol as well as bronchodilator therapy with DuoNeb's and as needed and scheduled basis.  3.  Acute kidney injury. -He will be hydrated with IV normal saline and will follow his BMP. -Nephrotoxins will be held off.  4.  Essential hypertension. -We will place the patient on as needed IV labetalol and hold off his Zestril given his acute kidney injury. -We will continue his Coreg.  5. depression. -We will continue Zoloft.  6.  DVT prophylaxis. -Subcutaneous Lovenox  All the records are reviewed and case discussed with ED provider. The plan of care was discussed in details with the patient (and family). I answered all questions. The patient agreed to proceed with the above mentioned plan. Further management will depend upon hospital course.   CODE STATUS: Full code  Status is: Inpatient  Remains inpatient appropriate because:Ongoing active pain requiring inpatient pain  management, Ongoing diagnostic testing needed not appropriate for outpatient work up, Unsafe d/c plan, IV treatments appropriate due to intensity of illness or inability to take PO and Inpatient level of care appropriate due to severity of illness   Dispo: The patient is from: Home              Anticipated d/c is to: Home              Anticipated d/c date is: 3 days              Patient currently is not medically stable to d/c.   TOTAL TIME TAKING CARE OF THIS PATIENT: 55 minutes.    Hannah Beat M.D on 01/23/2020 at 4:34 AM  [facility]   From 7 PM-7 AM, contact night-coverage [email or web address]  CC: Primary care physician; Jerl Mina, MD

## 2020-01-23 NOTE — ED Triage Notes (Signed)
Pt presents to ED with severe left sided chest / rib pain. Pt states he has been sick for the past week with cough and congestion. Had 2 negative COVID tests. Diaphoretic and pale during triage. Wheezing with increased work of breathing noted.

## 2020-01-23 NOTE — ED Provider Notes (Addendum)
[facility] Emergency Department Provider Note  ____________________________________________  Time seen: Approximately 2:15 AM  I have reviewed the triage vital signs and the nursing notes.   HISTORY  Chief Complaint Chest Pain and Shortness of Breath   HPI John Mckinney is a 54 y.o. male with a history of asthma, hypertension, hyperlipidemia, obesity who presents for evaluation of chest pain and shortness of breath.   Patient reports being sick for a week with cough and congestion.  His wife is also sick.  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  Has had 2 Covid test this week which were both negative.  Yesterday he reports that he was coughing when he felt a sharp pain in the left side of his chest.  The pain has become progressively worse.  The pain is mild at rest but with coughing or deep breathing becomes severe.  The pain is located on the left side of his chest.  Patient reports that he felt a pop when he was coughing before the pain started.  Has had wheezing.  He was using his inhaler which was working until today.  He denies any fever, chills.  He had one episode of nausea and vomiting the waiting room.  No diarrhea.  He is vaccinated against Covid and flu.  No personal or family history of PE or DVT, no recent travel immobilization, no leg pain or swelling, no hemoptysis or exogenous hormones.  Past Medical History:  Diagnosis Date  . Asthma   . History of adenomatous polyp of colon 12/25/2012  . Hypertension 12/25/2012    Patient Active Problem List   Diagnosis Date Noted  . Influenzal pneumonia 01/23/2020  . Encounter for long-term (current) use of high-risk medication 05/26/2015  . Obesity 05/26/2015  . Hx of colonic polyps 05/26/2015  . 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 Dyslipidemia 08/16/2014  . Bilateral inguinal hernia 05/24/2014  . Benign localized hyperplasia of prostate with urinary  obstruction 05/16/2013  . Hypogonadism male 05/16/2013  . 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 Lethargy 05/16/2013  . Essential hypertension 12/25/2012  . History of adenomatous polyp of colon 12/25/2012    Past Surgical History:  Procedure Laterality Date  . KNEE ARTHROSCOPY W/ ACL RECONSTRUCTION  1998   ACL Reconstruction  . OSTEOTOMY AND ULNAR SHORTENING     Ulnar and Radial fracture  . TONSILLECTOMY Bilateral age 62    Prior to Admission medications   Medication Sig Start Date End Date Taking? Authorizing Provider  Cholecalciferol (VITAMIN D PO) Take 5,000 Units by mouth daily. Reported on 04/10/2015    [provider]  CIPRODEX otic suspension Place 4 drops into both ears as needed. 04/28/16   [provider]  Coenzyme Q10 (CO Q-10) 100 MG CAPS Take 1 capsule by mouth daily.    [provider]  Cream Base (Q-DERM EX) Take 81 mg by mouth daily.    [provider]  diclofenac sodium (VOLTAREN) 1 % GEL Apply 2 g topically 4 (four) times daily. Rub into affected area of foot 2 to 4 times daily 04/02/15   Vivi Barrack, DPM  fexofenadine (ALLEGRA) 180 MG tablet Take 180 mg by mouth daily as needed.    [provider]  fluticasone (FLONASE) 50 MCG/ACT nasal spray Place 2 sprays into both nostrils daily. 05/05/16   Galen Manila, NP  KRILL OIL PO Take by mouth. Reported on  04/10/2015    [provider]  lisinopril (PRINIVIL,ZESTRIL) 20 MG tablet Take 1 tablet (20 mg total) by mouth daily. 06/18/16   Karamalegos, Netta Neat, DO  metoprolol tartrate (LOPRESSOR) 25 MG tablet TAKE 1 TABLET BY MOUTH TWICE DAILY 03/09/17   Galen Manila, NP  Multiple Vitamin (MULTIVITAMIN) capsule Take 1 capsule by mouth daily. Reported on 04/10/2015    [provider]  Testosterone 30 MG/ACT SOLN Apply 30 mg topically daily. 02/10/16   [provider]  testosterone cypionate (DEPOTESTOSTERONE CYPIONATE)  200 MG/ML injection Inject 200 mg into the muscle as directed. 04/22/16   [provider]    Allergies Patient has no known allergies.  Family History  Problem Relation Age of Onset  . Hypertension Mother   . Hyperlipidemia Father   . Hypertension Father   . Colon cancer Brother   . Hyperlipidemia Maternal Grandfather   . Hyperlipidemia Paternal Grandmother   . Hyperlipidemia Paternal Grandfather     Social History Social History   Tobacco Use  . Smoking status: Former Games developer  . Smokeless tobacco: Former Engineer, water Use Topics  . Alcohol use: No    Alcohol/week: 0.0 standard drinks  . Drug use: No    Review of Systems  Constitutional: Negative for fever. Eyes: Negative for visual changes. ENT: Negative for sore throat. Neck: No neck pain  Cardiovascular: +chest pain. Respiratory: + shortness of breath, cough, wheezing Gastrointestinal: Negative for abdominal pain, vomiting or diarrhea. Genitourinary: Negative for dysuria. Musculoskeletal: Negative for back pain. Skin: Negative for rash. Neurological: Negative for headaches, weakness or numbness. Psych: No SI or HI  ____________________________________________   PHYSICAL EXAM:  VITAL SIGNS: ED Triage Vitals  Enc Vitals Group     BP 01/23/20 0157 114/78     Pulse Rate 01/23/20 0151 87     Resp 01/23/20 0151 (!) 30     Temp 01/23/20 0205 98.5 F (36.9 C)     Temp Source 01/23/20 0157 Oral     SpO2 01/23/20 0151 96 %     Weight 01/23/20 0201 210 lb (95.3 kg)     Height 01/23/20 0201 5\' 6"  (1.676 m)     Head Circumference --      Peak Flow --      Pain Score 01/23/20 0201 5     Pain Loc --      Pain Edu? --      Excl. in GC? --     Constitutional: Alert and oriented, diaphoretic, pale.  HEENT:      Head: Normocephalic and atraumatic.         Eyes: Conjunctivae are normal. Sclera is non-icteric.       Mouth/Throat: Mucous membranes are moist.       Neck: Supple with no signs of  meningismus. Cardiovascular: Regular rate and rhythm. No murmurs, gallops, or rubs. 2+ symmetrical distal pulses are present in all extremities. No JVD. Respiratory: Increased work of breathing with wheezing bilaterally, tachypneic but not hypoxic Gastrointestinal: Soft, non tender, and non distended. Musculoskeletal: No edema, cyanosis, or erythema of extremities. Neurologic: Normal speech and language. Face is symmetric. Moving all extremities. No gross focal neurologic deficits are appreciated. Skin: Skin is warm, dry and intact. No rash noted. Psychiatric: Mood and affect are normal. Speech and behavior are normal.  ____________________________________________   LABS (all labs ordered are listed, but only abnormal results are displayed)  Labs Reviewed  RESP PANEL BY RT-PCR (FLU A&B, COVID) ARPGX2 - Abnormal; Notable  for the following components:      Result Value   Influenza A by PCR POSITIVE (*)    All other components within normal limits  BASIC METABOLIC PANEL - Abnormal; Notable for the following components:   Glucose, Bld 150 (*)    BUN 29 (*)    Creatinine, Ser 1.50 (*)    GFR, Estimated 54 (*)    All other components within normal limits  CBC WITH DIFFERENTIAL/PLATELET - Abnormal; Notable for the following components:   WBC 10.8 (*)    RBC 4.21 (*)    Abs Immature Granulocytes 0.11 (*)    All other components within normal limits  PROCALCITONIN  TYPE AND SCREEN  TROPONIN I (HIGH SENSITIVITY)  TROPONIN I (HIGH SENSITIVITY)   ____________________________________________  EKG  ED ECG REPORT I, Nita Sickle, the attending physician, personally viewed and interpreted this ECG.   Sinus rhythm, rate of 90, normal intervals, PVCs, no ST elevations or depressions. ____________________________________________  RADIOLOGY  I have personally reviewed the images performed during this visit and I agree with the Radiologist's read.   Interpretation by Radiologist:   DG Chest Port 1 View  Result Date: 01/23/2020 CLINICAL DATA:  Left-sided chest EXAM: PORTABLE CHEST 1 VIEW COMPARISON:  None. FINDINGS: The heart size and mediastinal contours are within normal limits. Streaky airspace opacity seen at the left lung base. There appears to be blunting of the left costophrenic angle which could be a small left pleural effusion. The visualized skeletal structures are unremarkable. IMPRESSION: Small left pleural effusion with adjacent streaky airspace opacity which could be due to atelectasis and/or infectious etiology. Electronically Signed   By: Jonna Clark M.D.   On: 01/23/2020 02:36   CT Angio Chest/Abd/Pel for Dissection W and/or W/WO  Result Date: 01/23/2020 CLINICAL DATA:  Left chest wall pain EXAM: CT ANGIOGRAPHY CHEST, ABDOMEN AND PELVIS TECHNIQUE: Non-contrast CT of the chest was initially obtained. Multidetector CT imaging through the chest, abdomen and pelvis was performed using the standard protocol during bolus administration of intravenous contrast. Multiplanar reconstructed images and MIPs were obtained and reviewed to evaluate the vascular anatomy. CONTRAST:  OMNIPAQUE IOHEXOL 350 MG/ML SOLN COMPARISON:  Radiograph same day FINDINGS: CTA CHEST FINDINGS Cardiovascular: --Heart: The heart size is normal.  There is nopericardial effusion. --Aorta: The course and caliber of the thoracic aorta are normal. There is no aortic atherosclerotic calcification. Precontrast images show no aortic intramural hematoma. There is no blood pool, dissection or penetrating ulcer demonstrated on arterial phase postcontrast imaging. There is a conventional 3 vessel aortic arch branching pattern. The proximal arch vessels are widely patent. --Pulmonary Arteries: Contrast timing is optimized for preferential opacification of the aorta. Within that limitation, normal central pulmonary arteries. Mediastinum/Nodes: No mediastinal, hilar or axillary lymphadenopathy. The visualized  thyroid and thoracic esophageal course are unremarkable. Lungs/Pleura: There is a small left pleural effusion present. Patchy airspace opacity is seen at the left lung base. The right lung is clear. Musculoskeletal: There is subcutaneous edema with edema in the external oblique musculature overlying the left lower chest wall. Review of the MIP images confirms the above findings. CTA ABDOMEN AND PELVIS FINDINGS VASCULAR Aorta: Normal caliber aorta without aneurysm, dissection, vasculitis or hemodynamically significant stenosis. There is scattered mild aortic atherosclerosis. Celiac: No aneurysm, dissection or hemodynamically significant stenosis. Normal branching pattern SMA: Widely patent without dissection or stenosis. Renals: Single renal arteries bilaterally. No aneurysm, dissection, stenosis or evidence of fibromuscular dysplasia. IMA: Patent without abnormality. Inflow: No aneurysm, stenosis  or dissection. Veins: Normal course and caliber of the major veins. Assessment is otherwise limited by the arterial dominant contrast phase. Review of the MIP images confirms the above findings. NON-VASCULAR Hepatobiliary: There is diffuse low density seen throughout the liver parenchyma. A peripherally enhancing lesion seen within the right liver lobe measuring 3.8 cm which is likely hemangioma. No visible biliary dilatation. Normal gallbladder. Pancreas: Normal contours without ductal dilatation. No peripancreatic fluid collection. Spleen: There is a 2 cm low-density lesion seen within the spleen. Adrenals/Urinary Tract: --Adrenal glands: Normal. --kidneys: There is mild right pelviectasis with small calcifications seen within the mid ureter the largest measuring 4 mm. There is also punctate calcifications seen in the upper pole of the right kidney. The left kidney is unremarkable. --Urinary bladder: Unremarkable. Stomach/Bowel: --Stomach/Duodenum: No hiatal hernia or other gastric abnormality. Normal duodenal course and  caliber. --Small bowel: No dilatation or inflammation. --Colon: No focal abnormality. --Appendix: Normal. Lymphatic:  No abdominal or pelvic lymphadenopathy. Reproductive: No free fluid in the pelvis. Calcifications seen within the prostate gland. Musculoskeletal. No bony spinal canal stenosis or focal osseous abnormality. Other: Small fat containing inguinal hernias are present. Review of the MIP images confirms the above findings. IMPRESSION: 1. No acute aortic abnormality. 2. Mild aortic atherosclerosis. Aortic Atherosclerosis (ICD10-I70.0). 3. Small left pleural effusion with adjacent patchy airspace opacity at the left lung base which could be due to pneumonia. 4. Subcutaneous and muscular edema seen along the left lower chest wall. 5. Mild right pelviectasis with mid ureteral calculi. 6. Hepatic steatosis Electronically Signed   By: Jonna ClarkBindu  Avutu M.D.   On: 01/23/2020 03:48     ____________________________________________   PROCEDURES  Procedure(s) performed:yes .1-3 Lead EKG Interpretation Performed by: Nita SickleVeronese, [place], MD Authorized by: Nita SickleVeronese, [place], MD     Interpretation: non-specific     ECG rate assessment: normal     Rhythm: sinus rhythm     Ectopy: PVCs     Critical Care performed: yes  CRITICAL CARE Performed by: Nita Sicklearolina Lajoyce Tamura  ?  Total critical care time: 45 min  Critical care time was exclusive of separately billable procedures and treating other patients.  Critical care was necessary to treat or prevent imminent or life-threatening deterioration.  Critical care was time spent personally by me on the following activities: development of treatment plan with patient and/or surrogate as well as nursing, discussions with consultants, evaluation of patient's response to treatment, examination of patient, obtaining history from patient or surrogate, ordering and performing treatments and interventions, ordering and review of laboratory studies, ordering and  review of radiographic studies, pulse oximetry and re-evaluation of patient's condition.  ____________________________________________   INITIAL IMPRESSION / ASSESSMENT AND PLAN / ED COURSE  54 y.o. male with a history of asthma, hypertension, hyperlipidemia, obesity who presents for evaluation of chest pain and shortness of breath in the setting of a week of cough and congestion.  Patient is pale and diaphoretic after having just vomited in the waiting room, increased work of breathing but not hypoxic, tachypneic, wheezing bilaterally.  No pitting edema, no asymmetric leg swelling.  EKG with no signs of acute ischemia.  Ddx asthma exacerbation versus Covid versus flu versus pneumonia versus PE versus viral syndrome versus pneumothorax.  We will start with 3 duo nebs and Solu-Medrol, give IV magnesium.  We will give her fentanyl and Zofran for chest pain, nausea and vomiting.  Will get chest x-ray and basic labs.  Will swab for Covid and flu  _________________________ 3:44 AM on  01/23/2020 -----------------------------------------  Patient deteriorating now hypotensive with systolics in the 70s.  There is a new large bruise on the left abdominal wall.  Due to concerns of dissection patient was taken to CT scan stat. I accompanied patient to CT. No evidence of dissection seen by me at the radiology suite, large L pleural effusion and PNA on the L with ? Hematoma in the subcutaneous/ muscle tissue. Radiology paged STAT for a read.  Patient started on IV fluid bolus with improvement of his blood pressure.    _________________________ 4:08 AM on 01/23/2020 -----------------------------------------  Discussed the read with the radiologist who believes it is edema in the muscle and chest wall that is somewhat shown on CT.  There is no active bleeding, there is no rib fractures.  There is no dissection there is no PE.  There is pneumonia and a pleural effusion on the left.  Blood pressure is  improving currently 115/63.  Patient given Rocephin and azithromycin.  Patient on 2 L nasal cannula for hypoxia with sats of 89%.  Procalcitonin is negative, mild leukocytosis with no left shift.  No signs of sepsis.  Troponin is negative with no signs of myocarditis.  Covid, flu, RSV pending.  Will discuss with the hospitalist for admission.  Old medical records reviewed.  Patient on telemetry for close monitoring.     _________________________ 4:28 AM on 01/23/2020 -----------------------------------------  Viral swab positive for influenza A.  Will give Tamiflu.  _____________________________________________ Please note:  Patient was evaluated in Emergency Department today for the symptoms described in the history of present illness. Patient was evaluated in the context of the global COVID-19 pandemic, which necessitated consideration that the patient might be at risk for infection with the SARS-CoV-2 virus that causes COVID-19. Institutional protocols and algorithms that pertain to the evaluation of patients at risk for COVID-19 are in a state of rapid change based on information released by regulatory bodies including the CDC and federal and state organizations. These policies and algorithms were followed during the patient's care in the ED.  Some ED evaluations and interventions may be delayed as a result of limited staffing during the pandemic.   [place] Controlled Substance Database was reviewed by me. ____________________________________________   FINAL CLINICAL IMPRESSION(S) / ED DIAGNOSES   Final diagnoses:  Community acquired pneumonia of left lower lobe of lung  Hypotension, unspecified hypotension type  AKI (acute kidney injury) (HCC)  Exacerbation of asthma, unspecified asthma severity, unspecified whether persistent  Acute respiratory failure with hypoxia (HCC)  Influenza A      NEW MEDICATIONS STARTED DURING THIS VISIT:  ED Discharge Orders    None       Note:  This  document was prepared using Dragon voice recognition software and may include unintentional dictation errors.    Don Perking, [place], MD 01/23/20 0410    Nita Sickle, MD 01/23/20 [phone]

## 2020-01-23 NOTE — ED Notes (Signed)
This RN received phone call. Pt calling out requesting pain medication. EDT explaining to pt that primary RN in a critical pt's room and will be in as soon as possible. Pt stating, "I know, I'm just inpatient and in pain."

## 2020-01-23 NOTE — Progress Notes (Signed)
PROGRESS NOTE    John Mckinney  UJW:[id number] DOB: 02/09/1966 DOA: 01/23/2020 PCP: Jerl MinaHedrick, James, MD   Brief Narrative:  John Mckinney  is a 54 y.o. Caucasian male with a known history of asthma and hypertension, who presented to the emergency room with acute onset of worsening cough with associated dyspnea and wheezing which has been going on for the last week.  He tested negative for Covid on an outpatient basis as well as his wife..  He stated that his cough was significant enough that he heard something pop in his left chest.  He had an episode of vomiting with associated diaphoresis and pallor.  His pain was extending to the left upper quadrant and to the left back with mild swelling.  With his vomiting, he dropped his pulse oximetry to 88% on room air as well as his systolic blood pressure to 64.  He denied any fever or chills at home but has been taking ibuprofen.  No diarrhea or melena or bright red bleeding per rectum.  No bilious vomitus or hematemesis.  No dysuria, oliguria or hematuria or flank pain.  Upon presentation to the emergency room, respiratory rate was 30 with otherwise normal vital signs.  BMP showed blood glucose of 150 and a creatinine of 1.5 with a BUN of 29.  Procalcitonin was less than 0.1 CBC showed a leukocytosis of 10.8.  COVID-19 PCR came back negative.  Influenza antigen A came back positive and B was negative.  Chest x-ray showed small left pleural effusion with adjacent streaky airspace opacity that could be related to atelectasis and/or infectious etiology.  Chest CTA revealed small left pleural effusion with adjacent patchy airspace opacity of the left lung base that could be due to pneumonia, subcutaneous and muscular edema seen along the left lower chest, mild right pelviectasis with med ureteral calculi, hepatic steatosis, mild aortic atherosclerosis and no acute aortic abnormality.  The patient was given IV Rocephin and Zithromax, 1 L bolus of IV normal  saline and 1 L bolus of IV lactated ringer, 2 g of IV magnesium sulfate, IV fentanyl, duo nebs x3 and then continuous albuterol nebulizer and p.o. Tamiflu.  She will be admitted to a progressive unit bed for further evaluation and management.  Assessment & Plan:   Active Problems:   Influenzal pneumonia   Acute asthma exacerbation   1.  Acute hypoxic respiratory failure secondary to acute asthma exacerbation and influenza A/small left pleural effusion: Patient feels much better than yesterday.  Denied any shortness of breath but still on 3 L on nasal oxygen and saturating about 95%.  We will try to wean oxygen as able to.  Doubt bacterial pneumonia.  Will discontinue antibiotics.  Continue Solu-Medrol and bronchodilators.  His symptoms has been at least 7 days also I will discontinue Tamiflu as well.  2.  Acute kidney injury.  Likely due to dehydration.  Resolved.  3.  Essential hypertension.  Controlled.  Continue metoprolol but continue to hold lisinopril.  4. depression.  Continue Zoloft.  5.  Left costochondritis: Also came in with chest pain.  Troponin x2 -.  This has improved.  His point tenderness.  Consistent with costochondritis.  Continue Toradol as needed every 6 hours.  DVT prophylaxis: enoxaparin (LOVENOX) injection 40 mg Start: 01/23/20 1000   Code Status: Full Code  Family Communication:  None present at bedside.  Plan of care discussed with patient in length and he verbalized understanding and agreed with it.  Status is: Inpatient  Remains inpatient appropriate because:Inpatient level of care appropriate due to severity of illness   Dispo: The patient is from: Home              Anticipated d/c is to: Home              Anticipated d/c date is: 1 day              Patient currently is not medically stable to d/c.        Estimated body mass index is 33.89 kg/m as calculated from the following:   Height as of this encounter: 5\' 6"  (1.676 m).   Weight as of this  encounter: 95.3 kg.      Nutritional status:               Consultants:   None  Procedures:   None  Antimicrobials:  Anti-infectives (From admission, onward)   Start     Dose/Rate Route Frequency Ordered Stop   01/23/20 2000  oseltamivir (TAMIFLU) capsule 75 mg        75 mg Oral 2 times daily 01/23/20 0434 01/28/20 1959   01/23/20 1830  azithromycin (ZITHROMAX) 500 mg in sodium chloride 0.9 % 250 mL IVPB        500 mg 250 mL/hr over 60 Minutes Intravenous Every 24 hours 01/23/20 0434 01/28/20 1829   01/23/20 1800  cefTRIAXone (ROCEPHIN) 2 g in sodium chloride 0.9 % 100 mL IVPB        2 g 200 mL/hr over 30 Minutes Intravenous Every 24 hours 01/23/20 0434 01/28/20 1759   01/23/20 0500  oseltamivir (TAMIFLU) capsule 75 mg        75 mg Oral  Once 01/23/20 0423 01/23/20 0720   01/23/20 0300  cefTRIAXone (ROCEPHIN) 1 g in sodium chloride 0.9 % 100 mL IVPB        1 g 200 mL/hr over 30 Minutes Intravenous  Once 01/23/20 0251 01/23/20 0435   01/23/20 0300  azithromycin (ZITHROMAX) tablet 500 mg        500 mg Oral  Once 01/23/20 0251 01/23/20 0307         Subjective: Patient seen and examined.  Still in the ED.  Complains of shortness of breath but improved compared to yesterday.  Still has cough but less productive.  He still has pain at the left anterior chest with coughing.  Objective: Vitals:   01/23/20 1100 01/23/20 1130 01/23/20 1200 01/23/20 1230  BP: 125/74 131/83 122/85 (!) 154/91  Pulse: 90 90 97 (!) 101  Resp:    20  Temp:      TempSrc:      SpO2: 92% 91% 92% 95%  Weight:      Height:        Intake/Output Summary (Last 24 hours) at 01/23/2020 1321 Last data filed at 01/23/2020 0501 Gross per 24 hour  Intake 2150 ml  Output --  Net 2150 ml   Filed Weights   01/23/20 0201  Weight: 95.3 kg    Examination:  General exam: Appears calm and comfortable  Respiratory system: Diffuse expiratory wheezes bilaterally. Respiratory effort normal.  No  rhonchi.  Left anterior rib cage tenderness. Cardiovascular system: S1 & S2 heard, RRR. No JVD, murmurs, rubs, gallops or clicks. No pedal edema. Gastrointestinal system: Abdomen is nondistended, soft and nontender. No organomegaly or masses felt. Normal bowel sounds heard. Central nervous system: Alert and oriented. No focal neurological deficits. Extremities: Symmetric 5 x 5 power. Skin:  No rashes, lesions or ulcers Psychiatry: Judgement and insight appear normal. Mood & affect appropriate.    Data Reviewed: I have personally reviewed following labs and imaging studies  CBC: Recent Labs  Lab 01/23/20 0212 01/23/20 0459  WBC 10.8* 11.7*  NEUTROABS 7.1  --   HGB 14.0 13.0  HCT 40.7 38.2*  MCV 96.7 97.0  PLT 246 152   Basic Metabolic Panel: Recent Labs  Lab 01/23/20 0208 01/23/20 0459  NA 137 139  K 3.9 4.4  CL 101 105  CO2 24 25  GLUCOSE 150* 125*  BUN 29* 30*  CREATININE 1.50* 1.05  CALCIUM 8.9 8.4*   GFR: Estimated Creatinine Clearance: 84.9 mL/min (by C-G formula based on SCr of 1.05 mg/dL). Liver Function Tests: No results for input(s): AST, ALT, ALKPHOS, BILITOT, PROT, ALBUMIN in the last 168 hours. No results for input(s): LIPASE, AMYLASE in the last 168 hours. No results for input(s): AMMONIA in the last 168 hours. Coagulation Profile: No results for input(s): INR, PROTIME in the last 168 hours. Cardiac Enzymes: No results for input(s): CKTOTAL, CKMB, CKMBINDEX, TROPONINI in the last 168 hours. BNP (last 3 results) No results for input(s): PROBNP in the last 8760 hours. HbA1C: No results for input(s): HGBA1C in the last 72 hours. CBG: No results for input(s): GLUCAP in the last 168 hours. Lipid Profile: No results for input(s): CHOL, HDL, LDLCALC, TRIG, CHOLHDL, LDLDIRECT in the last 72 hours. Thyroid Function Tests: No results for input(s): TSH, T4TOTAL, FREET4, T3FREE, THYROIDAB in the last 72 hours. Anemia Panel: No results for input(s): VITAMINB12,  FOLATE, FERRITIN, TIBC, IRON, RETICCTPCT in the last 72 hours. Sepsis Labs: Recent Labs  Lab 01/23/20 0212  PROCALCITON <0.10    Recent Results (from the past 240 hour(s))  Resp Panel by RT-PCR (Flu A&B, Covid) Nasopharyngeal Swab     Status: Abnormal   Collection Time: 01/23/20  2:13 AM   Specimen: Nasopharyngeal Swab; Nasopharyngeal(NP) swabs in vial transport medium  Result Value Ref Range Status   SARS Coronavirus 2 by RT PCR NEGATIVE NEGATIVE Final    Comment: (NOTE) SARS-CoV-2 target nucleic acids are NOT DETECTED.  The SARS-CoV-2 RNA is generally detectable in upper respiratory specimens during the acute phase of infection. The lowest concentration of SARS-CoV-2 viral copies this assay can detect is 138 copies/mL. A negative result does not preclude SARS-Cov-2 infection and should not be used as the sole basis for treatment or other patient management decisions. A negative result may occur with  improper specimen collection/handling, submission of specimen other than nasopharyngeal swab, presence of viral mutation(s) within the areas targeted by this assay, and inadequate number of viral copies(<138 copies/mL). A negative result must be combined with clinical observations, patient history, and epidemiological information. The expected result is Negative.  Fact Sheet for Patients:  BloggerCourse.com  Fact Sheet for Healthcare Providers:  SeriousBroker.it  This test is no t yet approved or cleared by the [place] FDA and  has been authorized for detection and/or diagnosis of SARS-CoV-2 by FDA under an Emergency Use Authorization (EUA). This EUA will remain  in effect (meaning this test can be used) for the duration of the COVID-19 declaration under Section 564(b)(1) of the Act, 21 U.S.C.section 360bbb-3(b)(1), unless the authorization is terminated  or revoked sooner.       Influenza A by PCR POSITIVE (A)  NEGATIVE Final   Influenza B by PCR NEGATIVE NEGATIVE Final    Comment: (NOTE) The Xpert Xpress SARS-CoV-2/FLU/RSV plus assay is intended  as an aid in the diagnosis of influenza from Nasopharyngeal swab specimens and should not be used as a sole basis for treatment. Nasal washings and aspirates are unacceptable for Xpert Xpress SARS-CoV-2/FLU/RSV testing.  Fact Sheet for Patients: BloggerCourse.com  Fact Sheet for Healthcare Providers: SeriousBroker.it  This test is not yet approved or cleared by the [place] FDA and has been authorized for detection and/or diagnosis of SARS-CoV-2 by FDA under an Emergency Use Authorization (EUA). This EUA will remain in effect (meaning this test can be used) for the duration of the COVID-19 declaration under Section 564(b)(1) of the Act, 21 U.S.C. section 360bbb-3(b)(1), unless the authorization is terminated or revoked.  Performed at [facility], [address] [place], [place] [address]       Radiology Studies: DG Chest Arnold 1 View  Result Date: 01/23/2020 CLINICAL DATA:  Left-sided chest EXAM: PORTABLE CHEST 1 VIEW COMPARISON:  None. FINDINGS: The heart size and mediastinal contours are within normal limits. Streaky airspace opacity seen at the left lung base. There appears to be blunting of the left costophrenic angle which could be a small left pleural effusion. The visualized skeletal structures are unremarkable. IMPRESSION: Small left pleural effusion with adjacent streaky airspace opacity which could be due to atelectasis and/or infectious etiology. Electronically Signed   By: Jonna Clark M.D.   On: 01/23/2020 02:36   CT Angio Chest/Abd/Pel for Dissection W and/or W/WO  Result Date: 01/23/2020 CLINICAL DATA:  Left chest wall pain EXAM: CT ANGIOGRAPHY CHEST, ABDOMEN AND PELVIS TECHNIQUE: Non-contrast CT of the chest was initially obtained. Multidetector CT imaging through  the chest, abdomen and pelvis was performed using the standard protocol during bolus administration of intravenous contrast. Multiplanar reconstructed images and MIPs were obtained and reviewed to evaluate the vascular anatomy. CONTRAST:  OMNIPAQUE IOHEXOL 350 MG/ML SOLN COMPARISON:  Radiograph same day FINDINGS: CTA CHEST FINDINGS Cardiovascular: --Heart: The heart size is normal.  There is nopericardial effusion. --Aorta: The course and caliber of the thoracic aorta are normal. There is no aortic atherosclerotic calcification. Precontrast images show no aortic intramural hematoma. There is no blood pool, dissection or penetrating ulcer demonstrated on arterial phase postcontrast imaging. There is a conventional 3 vessel aortic arch branching pattern. The proximal arch vessels are widely patent. --Pulmonary Arteries: Contrast timing is optimized for preferential opacification of the aorta. Within that limitation, normal central pulmonary arteries. Mediastinum/Nodes: No mediastinal, hilar or axillary lymphadenopathy. The visualized thyroid and thoracic esophageal course are unremarkable. Lungs/Pleura: There is a small left pleural effusion present. Patchy airspace opacity is seen at the left lung base. The right lung is clear. Musculoskeletal: There is subcutaneous edema with edema in the external oblique musculature overlying the left lower chest wall. Review of the MIP images confirms the above findings. CTA ABDOMEN AND PELVIS FINDINGS VASCULAR Aorta: Normal caliber aorta without aneurysm, dissection, vasculitis or hemodynamically significant stenosis. There is scattered mild aortic atherosclerosis. Celiac: No aneurysm, dissection or hemodynamically significant stenosis. Normal branching pattern SMA: Widely patent without dissection or stenosis. Renals: Single renal arteries bilaterally. No aneurysm, dissection, stenosis or evidence of fibromuscular dysplasia. IMA: Patent without abnormality. Inflow: No  aneurysm, stenosis or dissection. Veins: Normal course and caliber of the major veins. Assessment is otherwise limited by the arterial dominant contrast phase. Review of the MIP images confirms the above findings. NON-VASCULAR Hepatobiliary: There is diffuse low density seen throughout the liver parenchyma. A peripherally enhancing lesion seen within the right liver lobe measuring 3.8 cm which  is likely hemangioma. No visible biliary dilatation. Normal gallbladder. Pancreas: Normal contours without ductal dilatation. No peripancreatic fluid collection. Spleen: There is a 2 cm low-density lesion seen within the spleen. Adrenals/Urinary Tract: --Adrenal glands: Normal. --kidneys: There is mild right pelviectasis with small calcifications seen within the mid ureter the largest measuring 4 mm. There is also punctate calcifications seen in the upper pole of the right kidney. The left kidney is unremarkable. --Urinary bladder: Unremarkable. Stomach/Bowel: --Stomach/Duodenum: No hiatal hernia or other gastric abnormality. Normal duodenal course and caliber. --Small bowel: No dilatation or inflammation. --Colon: No focal abnormality. --Appendix: Normal. Lymphatic:  No abdominal or pelvic lymphadenopathy. Reproductive: No free fluid in the pelvis. Calcifications seen within the prostate gland. Musculoskeletal. No bony spinal canal stenosis or focal osseous abnormality. Other: Small fat containing inguinal hernias are present. Review of the MIP images confirms the above findings. IMPRESSION: 1. No acute aortic abnormality. 2. Mild aortic atherosclerosis. Aortic Atherosclerosis (ICD10-I70.0). 3. Small left pleural effusion with adjacent patchy airspace opacity at the left lung base which could be due to pneumonia. 4. Subcutaneous and muscular edema seen along the left lower chest wall. 5. Mild right pelviectasis with mid ureteral calculi. 6. Hepatic steatosis Electronically Signed   By: Jonna Clark M.D.   On: 01/23/2020 03:48     Scheduled Meds: . enoxaparin (LOVENOX) injection  40 mg Subcutaneous Q24H  . fluticasone  2 spray Each Nare Daily  . guaiFENesin  600 mg Oral BID  . ipratropium-albuterol  3 mL Nebulization QID  . loratadine  10 mg Oral Daily  . methylPREDNISolone (SOLU-MEDROL) injection  40 mg Intravenous Q6H   Followed by  . [START ON 01/24/2020] predniSONE  40 mg Oral Q breakfast  . metoprolol tartrate  25 mg Oral BID  . multivitamin with minerals  1 tablet Oral Daily  . oseltamivir  75 mg Oral BID  . testosterone cypionate  200 mg Intramuscular UD   Continuous Infusions: . sodium chloride 100 mL/hr at 01/23/20 0508  . azithromycin    . cefTRIAXone (ROCEPHIN)  IV       LOS: 0 days   Time spent: 35 minutes   Hughie Closs, MD [facility]  01/23/2020, 1:21 PM   To contact the attending provider between 7A-7P or the covering provider during after hours 7P-7A, please log into the web site [email or web address].

## 2020-01-23 NOTE — ED Notes (Addendum)
This RN introduced self to pt. Pain assessed. Pt stating pain is worse with movement but feels some relief from SOB. This RN explaining to pt that pain medication was administered at 0540 and would be unable to receive another dose. Pt verbalizes understanding.

## 2020-01-23 NOTE — ED Notes (Addendum)
Admit room marked as cleaning in process, receiving RN states, "send him up," upon arrival room is still being cleaned. Pt taken back to ED, will verify room status before sending

## 2020-01-23 NOTE — Progress Notes (Signed)
PHARMACIST - PHYSICIAN ORDER COMMUNICATION  CONCERNING: P&T Medication Policy on Herbal Medications  DESCRIPTION:  This patient's order for:  Q-derm and Co Q 10  has been noted.  This product(s) is classified as an "herbal" or natural product. Due to a lack of definitive safety studies or FDA approval, nonstandard manufacturing practices, plus the potential risk of unknown drug-drug interactions while on inpatient medications, the Pharmacy and Therapeutics Committee does not permit the use of "herbal" or natural products of this type within [facility].   ACTION TAKEN: The pharmacy department is unable to verify this order at this time and your patient has been informed of this safety policy. Please reevaluate patient's clinical condition at discharge and address if the herbal or natural product(s) should be resumed at that time.   Albina Billet, PharmD, BCPS Clinical Pharmacist 01/23/2020 7:08 AM

## 2020-01-23 NOTE — ED Notes (Signed)
ED TO INPATIENT HANDOFF REPORT  ED Nurse Name and Phone #: Hansel Devan [phone]  S Name/Age/Gender John Mckinney 54 y.o. male Room/Bed: ED36A/ED36A  Code Status   Code Status: Full Code  Home/SNF/Other Home Patient oriented to: self, place, time and situation Is this baseline? Yes   Triage Complete: Triage complete  Chief Complaint Influenzal pneumonia [J11.00] Acute asthma exacerbation [J45.901]  Triage Note Pt presents to ED with severe left sided chest / rib pain. Pt states he has been sick for the past week with cough and congestion. Had 2 negative COVID tests. Diaphoretic and pale during triage. Wheezing with increased work of breathing noted.     Allergies No Known Allergies  Level of Care/Admitting Diagnosis ED Disposition    ED Disposition Condition Comment   Admit  Hospital Area: [facility] [[id number]]  Level of Care: Med-Surg [16]  Covid Evaluation: Confirmed COVID Negative  Diagnosis: Acute asthma exacerbation [960454]  Admitting Physician: Hughie Closs [[id number]]  Attending Physician: Hughie Closs [id number]  Estimated length of stay: past midnight tomorrow  Certification:: I certify this patient will need inpatient services for at least 2 midnights       B Medical/Surgery History Past Medical History:  Diagnosis Date  . Asthma   . History of adenomatous polyp of colon 12/25/2012  . Hypertension 12/25/2012   Past Surgical History:  Procedure Laterality Date  . KNEE ARTHROSCOPY W/ ACL RECONSTRUCTION  1998   ACL Reconstruction  . OSTEOTOMY AND ULNAR SHORTENING     Ulnar and Radial fracture  . TONSILLECTOMY Bilateral age 78     A IV Location/Drains/Wounds Patient Lines/Drains/Airways Status    Active Line/Drains/Airways    Name Placement date Placement time Site Days   Peripheral IV 01/23/20 Left Antecubital 01/23/20  0209  Antecubital  less than 1   Peripheral IV 01/23/20 Right Antecubital 01/23/20  0300  Antecubital  less than  1          Intake/Output Last 24 hours  Intake/Output Summary (Last 24 hours) at 01/23/2020 1631 Last data filed at 01/23/2020 1534 Gross per 24 hour  Intake 2150 ml  Output 350 ml  Net 1800 ml    Labs/Imaging Results for orders placed or performed during the hospital encounter of 01/23/20 (from the past 48 hour(s))  Basic metabolic panel     Status: Abnormal   Collection Time: 01/23/20  2:08 AM  Result Value Ref Range   Sodium 137 135 - 145 mmol/L   Potassium 3.9 3.5 - 5.1 mmol/L   Chloride 101 98 - 111 mmol/L   CO2 24 22 - 32 mmol/L   Glucose, Bld 150 (H) 70 - 99 mg/dL    Comment: Glucose reference range applies only to samples taken after fasting for at least 8 hours.   BUN 29 (H) 6 - 20 mg/dL   Creatinine, Ser 2.13 (H) 0.61 - 1.24 mg/dL   Calcium 8.9 8.9 - 08.6 mg/dL   GFR, Estimated 54 (L) >60 mL/min    Comment: (NOTE) Calculated using the CKD-EPI Creatinine Equation (2021)    Anion gap 12 5 - 15    Comment: Performed at [facility], [address] [place], [place] [address]  Troponin I (High Sensitivity)     Status: None   Collection Time: 01/23/20  2:08 AM  Result Value Ref Range   Troponin I (High Sensitivity) 5 <18 ng/L    Comment: (NOTE) Elevated high sensitivity troponin I (hsTnI) values and significant  changes across  serial measurements may suggest ACS but many other  chronic and acute conditions are known to elevate hsTnI results.  Refer to the "Links" section for chest pain algorithms and additional  guidance. Performed at [facility], [address] [place], [place] [address]   CBC with Differential     Status: Abnormal   Collection Time: 01/23/20  2:12 AM  Result Value Ref Range   WBC 10.8 (H) 4.0 - 10.5 K/uL   RBC 4.21 (L) 4.22 - 5.81 MIL/uL   Hemoglobin 14.0 13.0 - 17.0 g/dL   HCT 43.3 39 - 52 %   MCV 96.7 80.0 - 100.0 fL   MCH 33.3 26.0 - 34.0 pg   MCHC 34.4 30.0 - 36.0 g/dL   RDW 29.5 18.8 - 41.6 %   Platelets 246  150 - 400 K/uL   nRBC 0.0 0.0 - 0.2 %   Neutrophils Relative % 66 %   Neutro Abs 7.1 1.7 - 7.7 K/uL   Lymphocytes Relative 24 %   Lymphs Abs 2.5 0.7 - 4.0 K/uL   Monocytes Relative 7 %   Monocytes Absolute 0.8 0.1 - 1.0 K/uL   Eosinophils Relative 2 %   Eosinophils Absolute 0.2 0.0 - 0.5 K/uL   Basophils Relative 0 %   Basophils Absolute 0.0 0.0 - 0.1 K/uL   Immature Granulocytes 1 %   Abs Immature Granulocytes 0.11 (H) 0.00 - 0.07 K/uL    Comment: Performed at [facility], [address] [place], [place] [address]  Procalcitonin - Baseline     Status: None   Collection Time: 01/23/20  2:12 AM  Result Value Ref Range   Procalcitonin <0.10 ng/mL    Comment:        Interpretation: PCT (Procalcitonin) <= 0.5 ng/mL: Systemic infection (sepsis) is not likely. Local bacterial infection is possible. (NOTE)       Sepsis PCT Algorithm           Lower Respiratory Tract                                      Infection PCT Algorithm    ----------------------------     ----------------------------         PCT < 0.25 ng/mL                PCT < 0.10 ng/mL          Strongly encourage             Strongly discourage   discontinuation of antibiotics    initiation of antibiotics    ----------------------------     -----------------------------       PCT 0.25 - 0.50 ng/mL            PCT 0.10 - 0.25 ng/mL               OR       >80% decrease in PCT            Discourage initiation of                                            antibiotics      Encourage discontinuation           of antibiotics    ----------------------------     -----------------------------  PCT >= 0.50 ng/mL              PCT 0.26 - 0.50 ng/mL               AND        <80% decrease in PCT             Encourage initiation of                                             antibiotics       Encourage continuation           of antibiotics    ----------------------------     -----------------------------        PCT  >= 0.50 ng/mL                  PCT > 0.50 ng/mL               AND         increase in PCT                  Strongly encourage                                      initiation of antibiotics    Strongly encourage escalation           of antibiotics                                     -----------------------------                                           PCT <= 0.25 ng/mL                                                 OR                                        > 80% decrease in PCT                                      Discontinue / Do not initiate                                             antibiotics  Performed at [facility], [address] [place], [place] [id number]   Resp Panel by RT-PCR (Flu A&B, Covid) Nasopharyngeal Swab     Status: Abnormal   Collection Time: 01/23/20  2:13 AM   Specimen: Nasopharyngeal Swab; Nasopharyngeal(NP) swabs in vial transport medium  Result Value Ref Range   SARS Coronavirus 2 by RT PCR NEGATIVE NEGATIVE  Comment: (NOTE) SARS-CoV-2 target nucleic acids are NOT DETECTED.  The SARS-CoV-2 RNA is generally detectable in upper respiratory specimens during the acute phase of infection. The lowest concentration of SARS-CoV-2 viral copies this assay can detect is 138 copies/mL. A negative result does not preclude SARS-Cov-2 infection and should not be used as the sole basis for treatment or other patient management decisions. A negative result may occur with  improper specimen collection/handling, submission of specimen other than nasopharyngeal swab, presence of viral mutation(s) within the areas targeted by this assay, and inadequate number of viral copies(<138 copies/mL). A negative result must be combined with clinical observations, patient history, and epidemiological information. The expected result is Negative.  Fact Sheet for Patients:  BloggerCourse.com  Fact Sheet for Healthcare Providers:   SeriousBroker.it  This test is no t yet approved or cleared by the [place] FDA and  has been authorized for detection and/or diagnosis of SARS-CoV-2 by FDA under an Emergency Use Authorization (EUA). This EUA will remain  in effect (meaning this test can be used) for the duration of the COVID-19 declaration under Section 564(b)(1) of the Act, 21 U.S.C.section 360bbb-3(b)(1), unless the authorization is terminated  or revoked sooner.       Influenza A by PCR POSITIVE (A) NEGATIVE   Influenza B by PCR NEGATIVE NEGATIVE    Comment: (NOTE) The Xpert Xpress SARS-CoV-2/FLU/RSV plus assay is intended as an aid in the diagnosis of influenza from Nasopharyngeal swab specimens and should not be used as a sole basis for treatment. Nasal washings and aspirates are unacceptable for Xpert Xpress SARS-CoV-2/FLU/RSV testing.  Fact Sheet for Patients: BloggerCourse.com  Fact Sheet for Healthcare Providers: SeriousBroker.it  This test is not yet approved or cleared by the [place] FDA and has been authorized for detection and/or diagnosis of SARS-CoV-2 by FDA under an Emergency Use Authorization (EUA). This EUA will remain in effect (meaning this test can be used) for the duration of the COVID-19 declaration under Section 564(b)(1) of the Act, 21 U.S.C. section 360bbb-3(b)(1), unless the authorization is terminated or revoked.  Performed at [facility], [address] [place], [place] [address]   Type and screen [facility]     Status: None   Collection Time: 01/23/20  3:24 AM  Result Value Ref Range   ABO/RH(D) A POS    Antibody Screen NEG    Sample Expiration      01/26/2020,2359 Performed at [facility], [address] [place], [place] [address]   Troponin I (High Sensitivity)     Status: None   Collection Time: 01/23/20  4:59 AM  Result Value Ref Range    Troponin I (High Sensitivity) 4 <18 ng/L    Comment: (NOTE) Elevated high sensitivity troponin I (hsTnI) values and significant  changes across serial measurements may suggest ACS but many other  chronic and acute conditions are known to elevate hsTnI results.  Refer to the "Links" section for chest pain algorithms and additional  guidance. Performed at [facility], [address] [place], [place] [address]   HIV Antibody (routine testing w rflx)     Status: None   Collection Time: 01/23/20  4:59 AM  Result Value Ref Range   HIV Screen 4th Generation wRfx Non Reactive Non Reactive    Comment: Performed at [facility], [address], [place], [place] [address]  Basic metabolic panel     Status: Abnormal   Collection Time: 01/23/20  4:59 AM  Result Value Ref Range   Sodium 139 135 - 145 mmol/L   Potassium 4.4 3.5 - 5.1 mmol/L   Chloride 105 98 - 111 mmol/L   CO2 25 22 - 32 mmol/L   Glucose, Bld 125 (H) 70 - 99 mg/dL    Comment: Glucose reference range applies only to samples taken after fasting for at least 8 hours.   BUN 30 (H) 6 - 20 mg/dL   Creatinine, Ser 1.91 0.61 - 1.24 mg/dL   Calcium 8.4 (L) 8.9 - 10.3 mg/dL   GFR, Estimated >47 >82 mL/min    Comment: (NOTE) Calculated using the CKD-EPI Creatinine Equation (2021)    Anion gap 9 5 - 15    Comment: Performed at [facility], [address] [place], [place] [address]  CBC     Status: Abnormal   Collection Time: 01/23/20  4:59 AM  Result Value Ref Range   WBC 11.7 (H) 4.0 - 10.5 K/uL   RBC 3.94 (L) 4.22 - 5.81 MIL/uL   Hemoglobin 13.0 13.0 - 17.0 g/dL   HCT 30.8 (L) 39 - 52 %   MCV 97.0 80.0 - 100.0 fL   MCH 33.0 26.0 - 34.0 pg   MCHC 34.0 30.0 - 36.0 g/dL   RDW 65.7 84.6 - 96.2 %   Platelets 152 150 - 400 K/uL   nRBC 0.0 0.0 - 0.2 %    Comment: Performed at [facility], [address] [place], [place] [address]  Strep pneumoniae urinary antigen     Status: None    Collection Time: 01/23/20  4:59 AM  Result Value Ref Range   Strep Pneumo Urinary Antigen NEGATIVE NEGATIVE    Comment:        Infection due to S. pneumoniae cannot be absolutely ruled out since the antigen present may be below the detection limit of the test. Performed at [facility], [address], [place], [place] [address]    DG Chest Port 1 View  Result Date: 01/23/2020 CLINICAL DATA:  Left-sided chest EXAM: PORTABLE CHEST 1 VIEW COMPARISON:  None. FINDINGS: The heart size and mediastinal contours are within normal limits. Streaky airspace opacity seen at the left lung base. There appears to be blunting of the left costophrenic angle which could be a small left pleural effusion. The visualized skeletal structures are unremarkable. IMPRESSION: Small left pleural effusion with adjacent streaky airspace opacity which could be due to atelectasis and/or infectious etiology. Electronically Signed   By: Jonna Clark M.D.   On: 01/23/2020 02:36   CT Angio Chest/Abd/Pel for Dissection W and/or W/WO  Result Date: 01/23/2020 CLINICAL DATA:  Left chest wall pain EXAM: CT ANGIOGRAPHY CHEST, ABDOMEN AND PELVIS TECHNIQUE: Non-contrast CT of the chest was initially obtained. Multidetector CT imaging through the chest, abdomen and pelvis was performed using the standard protocol during bolus administration of intravenous contrast. Multiplanar reconstructed images and MIPs were obtained and reviewed to evaluate the vascular anatomy. CONTRAST:  OMNIPAQUE IOHEXOL 350 MG/ML SOLN COMPARISON:  Radiograph same day FINDINGS: CTA CHEST FINDINGS Cardiovascular: --Heart: The heart size is normal.  There is nopericardial effusion. --Aorta: The course and caliber of the thoracic aorta are normal. There is no aortic atherosclerotic calcification. Precontrast images show no aortic intramural hematoma. There is no blood pool, dissection or penetrating ulcer demonstrated on arterial phase postcontrast imaging.  There is a conventional 3 vessel aortic arch branching pattern. The proximal arch vessels are widely patent. --Pulmonary Arteries: Contrast timing  is optimized for preferential opacification of the aorta. Within that limitation, normal central pulmonary arteries. Mediastinum/Nodes: No mediastinal, hilar or axillary lymphadenopathy. The visualized thyroid and thoracic esophageal course are unremarkable. Lungs/Pleura: There is a small left pleural effusion present. Patchy airspace opacity is seen at the left lung base. The right lung is clear. Musculoskeletal: There is subcutaneous edema with edema in the external oblique musculature overlying the left lower chest wall. Review of the MIP images confirms the above findings. CTA ABDOMEN AND PELVIS FINDINGS VASCULAR Aorta: Normal caliber aorta without aneurysm, dissection, vasculitis or hemodynamically significant stenosis. There is scattered mild aortic atherosclerosis. Celiac: No aneurysm, dissection or hemodynamically significant stenosis. Normal branching pattern SMA: Widely patent without dissection or stenosis. Renals: Single renal arteries bilaterally. No aneurysm, dissection, stenosis or evidence of fibromuscular dysplasia. IMA: Patent without abnormality. Inflow: No aneurysm, stenosis or dissection. Veins: Normal course and caliber of the major veins. Assessment is otherwise limited by the arterial dominant contrast phase. Review of the MIP images confirms the above findings. NON-VASCULAR Hepatobiliary: There is diffuse low density seen throughout the liver parenchyma. A peripherally enhancing lesion seen within the right liver lobe measuring 3.8 cm which is likely hemangioma. No visible biliary dilatation. Normal gallbladder. Pancreas: Normal contours without ductal dilatation. No peripancreatic fluid collection. Spleen: There is a 2 cm low-density lesion seen within the spleen. Adrenals/Urinary Tract: --Adrenal glands: Normal. --kidneys: There is mild right  pelviectasis with small calcifications seen within the mid ureter the largest measuring 4 mm. There is also punctate calcifications seen in the upper pole of the right kidney. The left kidney is unremarkable. --Urinary bladder: Unremarkable. Stomach/Bowel: --Stomach/Duodenum: No hiatal hernia or other gastric abnormality. Normal duodenal course and caliber. --Small bowel: No dilatation or inflammation. --Colon: No focal abnormality. --Appendix: Normal. Lymphatic:  No abdominal or pelvic lymphadenopathy. Reproductive: No free fluid in the pelvis. Calcifications seen within the prostate gland. Musculoskeletal. No bony spinal canal stenosis or focal osseous abnormality. Other: Small fat containing inguinal hernias are present. Review of the MIP images confirms the above findings. IMPRESSION: 1. No acute aortic abnormality. 2. Mild aortic atherosclerosis. Aortic Atherosclerosis (ICD10-I70.0). 3. Small left pleural effusion with adjacent patchy airspace opacity at the left lung base which could be due to pneumonia. 4. Subcutaneous and muscular edema seen along the left lower chest wall. 5. Mild right pelviectasis with mid ureteral calculi. 6. Hepatic steatosis Electronically Signed   By: Jonna Clark M.D.   On: 01/23/2020 03:48    Pending Labs Unresulted Labs (From admission, onward)          Start     Ordered   01/24/20 0500  CBC with Differential/Platelet  Tomorrow morning,   STAT        01/23/20 1335   01/24/20 0500  Basic metabolic panel  Tomorrow morning,   STAT        01/23/20 1335   01/24/20 0500  Magnesium  Tomorrow morning,   STAT        01/23/20 1335   01/23/20 0433  Culture, sputum-assessment  (COPD / Pneumonia )  Once,   STAT        01/23/20 0434   01/23/20 0433  Legionella Pneumophila Serogp 1 Ur Ag  (COPD / Pneumonia )  Once,   STAT        01/23/20 0434          Vitals/Pain Today's Vitals   01/23/20 1603 01/23/20 1604 01/23/20 1615 01/23/20 1630  BP:    118/90  Pulse: (!) 104 (!)  103 99 (!) 102  Resp:    18  Temp:    98.8 F (37.1 C)  TempSrc:    Oral  SpO2: 94% 94% 96% 96%  Weight:      Height:      PainSc:    0-No pain    Isolation Precautions No active isolations  Medications Medications  metoprolol tartrate (LOPRESSOR) tablet 25 mg (25 mg Oral Not Given 01/23/20 1253)  testosterone cypionate (DEPOTESTOSTERONE CYPIONATE) injection 200 mg (has no administration in time range)  multivitamin with minerals tablet 1 tablet (1 tablet Oral Given 01/23/20 1241)  fluticasone (FLONASE) 50 MCG/ACT nasal spray 2 spray (2 sprays Each Nare Not Given 01/23/20 1347)  loratadine (CLARITIN) tablet 10 mg (10 mg Oral Given 01/23/20 1241)  enoxaparin (LOVENOX) injection 40 mg (40 mg Subcutaneous Given 01/23/20 1242)  0.9 %  sodium chloride infusion ( Intravenous New Bag/Given 01/23/20 1603)  acetaminophen (TYLENOL) tablet 650 mg (has no administration in time range)    Or  acetaminophen (TYLENOL) suppository 650 mg (has no administration in time range)  traZODone (DESYREL) tablet 25 mg (has no administration in time range)  magnesium hydroxide (MILK OF MAGNESIA) suspension 30 mL (has no administration in time range)  ondansetron (ZOFRAN) tablet 4 mg ( Oral See Alternative 01/23/20 1241)    Or  ondansetron (ZOFRAN) injection 4 mg (4 mg Intravenous Given 01/23/20 1241)  methylPREDNISolone sodium succinate (SOLU-MEDROL) 40 mg/mL injection 40 mg (40 mg Intravenous Given 01/23/20 1241)    Followed by  predniSONE (DELTASONE) tablet 40 mg (has no administration in time range)  ipratropium-albuterol (DUONEB) 0.5-2.5 (3) MG/3ML nebulizer solution 3 mL (3 mLs Nebulization Given 01/23/20 1534)  morphine 2 MG/ML injection 2 mg (2 mg Intravenous Given 01/23/20 1256)  oxyCODONE-acetaminophen (PERCOCET/ROXICET) 5-325 MG per tablet 1-2 tablet (2 tablets Oral Given 01/23/20 1241)  guaiFENesin (MUCINEX) 12 hr tablet 600 mg (600 mg Oral Given 01/23/20 1241)  chlorpheniramine-HYDROcodone (TUSSIONEX) 10-8  MG/5ML suspension 5 mL (has no administration in time range)  ketorolac (TORADOL) 15 MG/ML injection 15 mg (has no administration in time range)  magnesium sulfate IVPB 2 g 50 mL (0 g Intravenous Stopped 01/23/20 0320)  ipratropium-albuterol (DUONEB) 0.5-2.5 (3) MG/3ML nebulizer solution 3 mL (3 mLs Nebulization Given 01/23/20 0222)  ipratropium-albuterol (DUONEB) 0.5-2.5 (3) MG/3ML nebulizer solution 3 mL (3 mLs Nebulization Given 01/23/20 0222)  ipratropium-albuterol (DUONEB) 0.5-2.5 (3) MG/3ML nebulizer solution 3 mL (3 mLs Nebulization Given 01/23/20 0221)  methylPREDNISolone sodium succinate (SOLU-MEDROL) 125 mg/2 mL injection 125 mg (125 mg Intravenous Given 01/23/20 0221)  fentaNYL (SUBLIMAZE) injection 50 mcg (50 mcg Intravenous Given 01/23/20 0221)  ondansetron (ZOFRAN) injection 4 mg (4 mg Intravenous Given 01/23/20 0221)  lactated ringers bolus 1,000 mL (0 mLs Intravenous Stopped 01/23/20 0341)  albuterol (PROVENTIL) (2.5 MG/3ML) 0.083% nebulizer solution 10 mg (10 mg Nebulization Given 01/23/20 0307)  cefTRIAXone (ROCEPHIN) 1 g in sodium chloride 0.9 % 100 mL IVPB (0 g Intravenous Stopped 01/23/20 0435)  azithromycin (ZITHROMAX) tablet 500 mg (500 mg Oral Given 01/23/20 0307)  iohexol (OMNIPAQUE) 350 MG/ML injection 100 mL (100 mLs Intravenous Contrast Given 01/23/20 0327)  sodium chloride 0.9 % bolus 1,000 mL (0 mLs Intravenous Stopped 01/23/20 0501)  oseltamivir (TAMIFLU) capsule 75 mg (75 mg Oral Given 01/23/20 0720)    Mobility walks Low fall risk   Focused Assessments Pulmonary Assessment Handoff:  Lung sounds: Bilateral Breath Sounds: Expiratory wheezes, Coarse crackles O2 Device: Nasal Cannula O2 Flow Rate (L/min):  3 L/min      R Recommendations: See Admitting Provider Note  Report given to:   Additional Notes:

## 2020-01-24 LAB — CBC WITH DIFFERENTIAL/PLATELET
Abs Immature Granulocytes: 0.17 10*3/uL — ABNORMAL HIGH (ref 0.00–0.07)
Basophils Absolute: 0 10*3/uL (ref 0.0–0.1)
Basophils Relative: 0 %
Eosinophils Absolute: 0 10*3/uL (ref 0.0–0.5)
Eosinophils Relative: 0 %
HCT: 34.6 % — ABNORMAL LOW (ref 39.0–52.0)
Hemoglobin: 11.9 g/dL — ABNORMAL LOW (ref 13.0–17.0)
Immature Granulocytes: 1 %
Lymphocytes Relative: 6 %
Lymphs Abs: 0.8 10*3/uL (ref 0.7–4.0)
MCH: 33.2 pg (ref 26.0–34.0)
MCHC: 34.4 g/dL (ref 30.0–36.0)
MCV: 96.6 fL (ref 80.0–100.0)
Monocytes Absolute: 0.8 10*3/uL (ref 0.1–1.0)
Monocytes Relative: 5 %
Neutro Abs: 13.5 10*3/uL — ABNORMAL HIGH (ref 1.7–7.7)
Neutrophils Relative %: 88 %
Platelets: 195 10*3/uL (ref 150–400)
RBC: 3.58 MIL/uL — ABNORMAL LOW (ref 4.22–5.81)
RDW: 12.1 % (ref 11.5–15.5)
WBC: 15.3 10*3/uL — ABNORMAL HIGH (ref 4.0–10.5)
nRBC: 0 % (ref 0.0–0.2)

## 2020-01-24 LAB — BASIC METABOLIC PANEL
Anion gap: 9 (ref 5–15)
BUN: 23 mg/dL — ABNORMAL HIGH (ref 6–20)
CO2: 24 mmol/L (ref 22–32)
Calcium: 8.6 mg/dL — ABNORMAL LOW (ref 8.9–10.3)
Chloride: 104 mmol/L (ref 98–111)
Creatinine, Ser: 0.84 mg/dL (ref 0.61–1.24)
GFR, Estimated: >60 mL/min (ref >60)
Glucose, Bld: 194 mg/dL — ABNORMAL HIGH (ref 70–99)
Potassium: 4.3 mmol/L (ref 3.5–5.1)
Sodium: 137 mmol/L (ref 135–145)

## 2020-01-24 LAB — RETICULOCYTES
Immature Retic Fract: 27.6 % — ABNORMAL HIGH (ref 2.3–15.9)
RBC.: 3.47 MIL/uL — ABNORMAL LOW (ref 4.22–5.81)
Retic Count, Absolute: 76.7 10*3/uL (ref 19.0–186.0)
Retic Ct Pct: 2.2 % (ref 0.4–3.1)

## 2020-01-24 LAB — MAGNESIUM: Magnesium: 2.3 mg/dL (ref 1.7–2.4)

## 2020-01-24 LAB — IRON AND TIBC
Iron: 68 ug/dL (ref 45–182)
Saturation Ratios: 22 % (ref 17.9–39.5)
TIBC: 307 ug/dL (ref 250–450)
UIBC: 239 ug/dL

## 2020-01-24 LAB — HEMOGLOBIN AND HEMATOCRIT, BLOOD
HCT: 34.7 % — ABNORMAL LOW (ref 39.0–52.0)
Hemoglobin: 11.7 g/dL — ABNORMAL LOW (ref 13.0–17.0)

## 2020-01-24 LAB — FOLATE: Folate: 13.5 ng/mL (ref >5.9)

## 2020-01-24 LAB — FERRITIN: Ferritin: 265 ng/mL (ref 24–336)

## 2020-01-24 LAB — VITAMIN B12: Vitamin B-12: 222 pg/mL (ref 180–914)

## 2020-01-24 LAB — LEGIONELLA PNEUMOPHILA SEROGP 1 UR AG: L. pneumophila Serogp 1 Ur Ag: NEGATIVE

## 2020-01-24 MED ORDER — SERTRALINE HCL 50 MG PO TABS
100.0000 mg | ORAL_TABLET | Freq: Every day | ORAL | Status: DC
  Administered 2020-01-24 – 2020-01-25 (×2): 100 mg via ORAL
  Filled 2020-01-24 (×2): qty 2

## 2020-01-24 MED ORDER — BISACODYL 10 MG RE SUPP
10.0000 mg | Freq: Every day | RECTAL | Status: DC | PRN
  Administered 2020-01-24: 10 mg via RECTAL
  Filled 2020-01-24: qty 1

## 2020-01-24 MED ORDER — BENZONATATE 100 MG PO CAPS
100.0000 mg | ORAL_CAPSULE | Freq: Three times a day (TID) | ORAL | Status: DC | PRN
  Administered 2020-01-24: 100 mg via ORAL
  Filled 2020-01-24: qty 1

## 2020-01-24 MED ORDER — CARVEDILOL 25 MG PO TABS
25.0000 mg | ORAL_TABLET | Freq: Two times a day (BID) | ORAL | Status: DC
  Administered 2020-01-24 – 2020-01-25 (×2): 25 mg via ORAL
  Filled 2020-01-24 (×2): qty 1

## 2020-01-24 MED ORDER — SIMVASTATIN 20 MG PO TABS
10.0000 mg | ORAL_TABLET | Freq: Every day | ORAL | Status: DC
  Administered 2020-01-24 – 2020-01-25 (×2): 10 mg via ORAL
  Filled 2020-01-24 (×2): qty 1

## 2020-01-24 MED ORDER — POLYETHYLENE GLYCOL 3350 17 G PO PACK
17.0000 g | PACK | Freq: Every day | ORAL | Status: DC
  Administered 2020-01-24 – 2020-01-25 (×2): 17 g via ORAL
  Filled 2020-01-24 (×2): qty 1

## 2020-01-24 MED ORDER — ZOLPIDEM TARTRATE 5 MG PO TABS
10.0000 mg | ORAL_TABLET | Freq: Every evening | ORAL | Status: DC | PRN
  Administered 2020-01-24: 10 mg via ORAL
  Filled 2020-01-24: qty 2

## 2020-01-24 MED ORDER — IRBESARTAN 150 MG PO TABS
300.0000 mg | ORAL_TABLET | Freq: Every day | ORAL | Status: DC
  Administered 2020-01-24 – 2020-01-25 (×2): 300 mg via ORAL
  Filled 2020-01-24 (×2): qty 2

## 2020-01-24 NOTE — Plan of Care (Signed)

## 2020-01-24 NOTE — Progress Notes (Signed)
PROGRESS NOTE    KALAB CAMPS  KVQ:[id number] DOB: 02-Jul-1965 DOA: 01/23/2020 PCP: Jerl Mina, MD   Brief Narrative:  John Mckinney  is a 54 y.o. Caucasian male with a known history of asthma and hypertension, who presented to the emergency room with acute onset of worsening cough with associated dyspnea and wheezing which has been going on for the last week.  He tested negative for Covid on an outpatient basis as well as his wife..  He stated that his cough was significant enough that he heard something pop in his left chest.  He had an episode of vomiting with associated diaphoresis and pallor.  His pain was extending to the left upper quadrant and to the left back with mild swelling.  With his vomiting, he dropped his pulse oximetry to 88% on room air as well as his systolic blood pressure to 64.  He denied any fever or chills at home but has been taking ibuprofen.  No diarrhea or melena or bright red bleeding per rectum.  No bilious vomitus or hematemesis.  No dysuria, oliguria or hematuria or flank pain.  Upon presentation to the emergency room, respiratory rate was 30 with otherwise normal vital signs.  BMP showed blood glucose of 150 and a creatinine of 1.5 with a BUN of 29.  Procalcitonin was less than 0.1 CBC showed a leukocytosis of 10.8.  COVID-19 PCR came back negative.  Influenza antigen A came back positive and B was negative.  Chest x-ray showed small left pleural effusion with adjacent streaky airspace opacity that could be related to atelectasis and/or infectious etiology.  Chest CTA revealed small left pleural effusion with adjacent patchy airspace opacity of the left lung base that could be due to pneumonia, subcutaneous and muscular edema seen along the left lower chest, mild right pelviectasis with med ureteral calculi, hepatic steatosis, mild aortic atherosclerosis and no acute aortic abnormality.  The patient was given IV Rocephin and Zithromax, 1 L bolus of IV normal  saline and 1 L bolus of IV lactated ringer, 2 g of IV magnesium sulfate, IV fentanyl, duo nebs x3 and then continuous albuterol nebulizer and p.o. Tamiflu.  She will be admitted to a progressive unit bed for further evaluation and management.  Assessment & Plan:   Active Problems:   Influenzal pneumonia   Acute asthma exacerbation   Acute respiratory failure with hypoxia (HCC)   1.  Acute hypoxic respiratory failure secondary to acute asthma exacerbation and influenza A/small left pleural effusion: Patient feels much better than yesterday.  Denied any shortness of breath and has been weaned down to room air and saturating more than 94%.  Switching IV Solu-Medrol to oral prednisone today and continue bronchodilators.  2.  Acute kidney injury.  Likely due to dehydration.  Resolved.  3.  Essential hypertension.  Fairly controlled.  Resume home dose of carvedilol and valsartan.  4. depression.  Continue Zoloft.  5.  Left costochondritis: Also came in with chest pain.  Troponin x2 -.  This has improved.  His point tenderness.  Consistent with costochondritis.  Continue Toradol as needed every 6 hours.  6.  Left chest wall hematoma: Patient's hematoma has enlarged.  He is slightly tender to palpation.  He is concerned about that.  CT chest with IV contrast showed edema and no extravasation of blood.  His hemoglobin has dropped as well.  We will keep patient in the hospital and monitor for next 24 hours.  If worsening hematoma, will consider  repeating CT chest with contrast.  7.  Acute blood loss anemia: Secondary to left chest wall hematoma.  Hemoglobin dropped from 14-11.9.  Repeat at 2 PM today and again tomorrow morning.  If further drop in hemoglobin, will transfuse if drops less than 7.  DVT prophylaxis: enoxaparin (LOVENOX) injection 40 mg Start: 01/23/20 1000   Code Status: Full Code  Family Communication: Wife present at bedside.  Plan of care discussed with patient in length and he  verbalized understanding and agreed with it.  Status is: Inpatient  Remains inpatient appropriate because:Inpatient level of care appropriate due to severity of illness   Dispo: The patient is from: Home              Anticipated d/c is to: Home              Anticipated d/c date is: 1 day              Patient currently is not medically stable to d/c.        Estimated body mass index is 33.89 kg/m as calculated from the following:   Height as of this encounter: 5\' 6"  (1.676 m).   Weight as of this encounter: 95.3 kg.      Nutritional status:               Consultants:   None  Procedures:   None  Antimicrobials:  Anti-infectives (From admission, onward)   Start     Dose/Rate Route Frequency Ordered Stop   01/23/20 2000  oseltamivir (TAMIFLU) capsule 75 mg  Status:  Discontinued        75 mg Oral 2 times daily 01/23/20 0434 01/23/20 1325   01/23/20 1830  azithromycin (ZITHROMAX) 500 mg in sodium chloride 0.9 % 250 mL IVPB  Status:  Discontinued        500 mg 250 mL/hr over 60 Minutes Intravenous Every 24 hours 01/23/20 0434 01/23/20 1325   01/23/20 1800  cefTRIAXone (ROCEPHIN) 2 g in sodium chloride 0.9 % 100 mL IVPB  Status:  Discontinued        2 g 200 mL/hr over 30 Minutes Intravenous Every 24 hours 01/23/20 0434 01/23/20 1325   01/23/20 0500  oseltamivir (TAMIFLU) capsule 75 mg        75 mg Oral  Once 01/23/20 0423 01/23/20 0720   01/23/20 0300  cefTRIAXone (ROCEPHIN) 1 g in sodium chloride 0.9 % 100 mL IVPB        1 g 200 mL/hr over 30 Minutes Intravenous  Once 01/23/20 0251 01/23/20 0435   01/23/20 0300  azithromycin (ZITHROMAX) tablet 500 mg        500 mg Oral  Once 01/23/20 0251 01/23/20 0307         Subjective:  Seen and examined.  Overall his breathing is much improved compared to yesterday.  He is off of oxygen and saturating more than 94% however he is concerned about enlarging hematoma on the left chest wall.   Objective: Vitals:    01/23/20 2116 01/23/20 2345 01/24/20 0530 01/24/20 0807  BP: 137/72 (!) 138/93 (!) 143/90 140/87  Pulse: (!) 108 (!) 101 96 89  Resp: 16 16 16 18   Temp: 97.7 F (36.5 C) 98.4 F (36.9 C) 97.9 F (36.6 C) 97.8 F (36.6 C)  TempSrc: Oral Oral Oral Oral  SpO2: 95% 94% 96% 97%  Weight:      Height:        Intake/Output Summary (Last 24 hours)  at 01/24/2020 1058 Last data filed at 01/23/2020 1534 Gross per 24 hour  Intake --  Output 350 ml  Net -350 ml   Filed Weights   01/23/20 0201  Weight: 95.3 kg    Examination:  General exam: Appears calm and comfortable, obese Respiratory system: Minimal end expiratory wheezes, no rhonchi. Respiratory effort normal. Cardiovascular system: S1 & S2 heard, RRR. No JVD, murmurs, rubs, gallops or clicks. No pedal edema. Gastrointestinal system: Abdomen is nondistended, soft and nontender. No organomegaly or masses felt. Normal bowel sounds heard. Central nervous system: Alert and oriented. No focal neurological deficits. Extremities: Symmetric 5 x 5 power. Skin: Large hematoma at the left chest wall. Psychiatry: Judgement and insight appear normal. Mood & affect appropriate.    Data Reviewed: I have personally reviewed following labs and imaging studies  CBC: Recent Labs  Lab 01/23/20 0212 01/23/20 0459 01/24/20 0454  WBC 10.8* 11.7* 15.3*  NEUTROABS 7.1  --  13.5*  HGB 14.0 13.0 11.9*  HCT 40.7 38.2* 34.6*  MCV 96.7 97.0 96.6  PLT 246 152 195   Basic Metabolic Panel: Recent Labs  Lab 01/23/20 0208 01/23/20 0459 01/24/20 0454  NA 137 139 137  K 3.9 4.4 4.3  CL 101 105 104  CO2 GLUCOSE 150* 125* 194*  BUN 29* 30* 23*  CREATININE 1.50* 1.05 0.84  CALCIUM 8.9 8.4* 8.6*  MG  --   --  2.3   GFR: Estimated Creatinine Clearance: 106.1 mL/min (by C-G formula based on SCr of 0.84 mg/dL). Liver Function Tests: No results for input(s): AST, ALT, ALKPHOS, BILITOT, PROT, ALBUMIN in the last 168 hours. No results for  input(s): LIPASE, AMYLASE in the last 168 hours. No results for input(s): AMMONIA in the last 168 hours. Coagulation Profile: No results for input(s): INR, PROTIME in the last 168 hours. Cardiac Enzymes: No results for input(s): CKTOTAL, CKMB, CKMBINDEX, TROPONINI in the last 168 hours. BNP (last 3 results) No results for input(s): PROBNP in the last 8760 hours. HbA1C: No results for input(s): HGBA1C in the last 72 hours. CBG: No results for input(s): GLUCAP in the last 168 hours. Lipid Profile: No results for input(s): CHOL, HDL, LDLCALC, TRIG, CHOLHDL, LDLDIRECT in the last 72 hours. Thyroid Function Tests: No results for input(s): TSH, T4TOTAL, FREET4, T3FREE, THYROIDAB in the last 72 hours. Anemia Panel: Recent Labs    01/24/20 0454  VITAMINB12 222  FOLATE 13.5  FERRITIN 265  TIBC 307  IRON 68  RETICCTPCT 2.2   Sepsis Labs: Recent Labs  Lab 01/23/20 0212  PROCALCITON <0.10    Recent Results (from the past 240 hour(s))  Resp Panel by RT-PCR (Flu A&B, Covid) Nasopharyngeal Swab     Status: Abnormal   Collection Time: 01/23/20  2:13 AM   Specimen: Nasopharyngeal Swab; Nasopharyngeal(NP) swabs in vial transport medium  Result Value Ref Range Status   SARS Coronavirus 2 by RT PCR NEGATIVE NEGATIVE Final    Comment: (NOTE) SARS-CoV-2 target nucleic acids are NOT DETECTED.  The SARS-CoV-2 RNA is generally detectable in upper respiratory specimens during the acute phase of infection. The lowest concentration of SARS-CoV-2 viral copies this assay can detect is 138 copies/mL. A negative result does not preclude SARS-Cov-2 infection and should not be used as the sole basis for treatment or other patient management decisions. A negative result may occur with  improper specimen collection/handling, submission of specimen other than nasopharyngeal swab, presence of viral mutation(s) within the areas targeted by this  assay, and inadequate number of viral copies(<138  copies/mL). A negative result must be combined with clinical observations, patient history, and epidemiological information. The expected result is Negative.  Fact Sheet for Patients:  BloggerCourse.com  Fact Sheet for Healthcare Providers:  SeriousBroker.it  This test is no t yet approved or cleared by the [place] FDA and  has been authorized for detection and/or diagnosis of SARS-CoV-2 by FDA under an Emergency Use Authorization (EUA). This EUA will remain  in effect (meaning this test can be used) for the duration of the COVID-19 declaration under Section 564(b)(1) of the Act, 21 U.S.C.section 360bbb-3(b)(1), unless the authorization is terminated  or revoked sooner.       Influenza A by PCR POSITIVE (A) NEGATIVE Final   Influenza B by PCR NEGATIVE NEGATIVE Final    Comment: (NOTE) The Xpert Xpress SARS-CoV-2/FLU/RSV plus assay is intended as an aid in the diagnosis of influenza from Nasopharyngeal swab specimens and should not be used as a sole basis for treatment. Nasal washings and aspirates are unacceptable for Xpert Xpress SARS-CoV-2/FLU/RSV testing.  Fact Sheet for Patients: BloggerCourse.com  Fact Sheet for Healthcare Providers: SeriousBroker.it  This test is not yet approved or cleared by the [place] FDA and has been authorized for detection and/or diagnosis of SARS-CoV-2 by FDA under an Emergency Use Authorization (EUA). This EUA will remain in effect (meaning this test can be used) for the duration of the COVID-19 declaration under Section 564(b)(1) of the Act, 21 U.S.C. section 360bbb-3(b)(1), unless the authorization is terminated or revoked.  Performed at [facility], [address], [place], [place] [address]       Radiology Studies: DG Chest [place] 1 View  Result Date: 01/23/2020 CLINICAL DATA:  Left-sided chest EXAM: PORTABLE  CHEST 1 VIEW COMPARISON:  None. FINDINGS: The heart size and mediastinal contours are within normal limits. Streaky airspace opacity seen at the left lung base. There appears to be blunting of the left costophrenic angle which could be a small left pleural effusion. The visualized skeletal structures are unremarkable. IMPRESSION: Small left pleural effusion with adjacent streaky airspace opacity which could be due to atelectasis and/or infectious etiology. Electronically Signed   By: Jonna Clark M.D.   On: 01/23/2020 02:36   CT Angio Chest/Abd/Pel for Dissection W and/or W/WO  Result Date: 01/23/2020 CLINICAL DATA:  Left chest wall pain EXAM: CT ANGIOGRAPHY CHEST, ABDOMEN AND PELVIS TECHNIQUE: Non-contrast CT of the chest was initially obtained. Multidetector CT imaging through the chest, abdomen and pelvis was performed using the standard protocol during bolus administration of intravenous contrast. Multiplanar reconstructed images and MIPs were obtained and reviewed to evaluate the vascular anatomy. CONTRAST:  OMNIPAQUE IOHEXOL 350 MG/ML SOLN COMPARISON:  Radiograph same day FINDINGS: CTA CHEST FINDINGS Cardiovascular: --Heart: The heart size is normal.  There is nopericardial effusion. --Aorta: The course and caliber of the thoracic aorta are normal. There is no aortic atherosclerotic calcification. Precontrast images show no aortic intramural hematoma. There is no blood pool, dissection or penetrating ulcer demonstrated on arterial phase postcontrast imaging. There is a conventional 3 vessel aortic arch branching pattern. The proximal arch vessels are widely patent. --Pulmonary Arteries: Contrast timing is optimized for preferential opacification of the aorta. Within that limitation, normal central pulmonary arteries. Mediastinum/Nodes: No mediastinal, hilar or axillary lymphadenopathy. The visualized thyroid and thoracic esophageal course are unremarkable. Lungs/Pleura: There is a small left pleural  effusion present. Patchy airspace opacity is seen at the left lung base. The  right lung is clear. Musculoskeletal: There is subcutaneous edema with edema in the external oblique musculature overlying the left lower chest wall. Review of the MIP images confirms the above findings. CTA ABDOMEN AND PELVIS FINDINGS VASCULAR Aorta: Normal caliber aorta without aneurysm, dissection, vasculitis or hemodynamically significant stenosis. There is scattered mild aortic atherosclerosis. Celiac: No aneurysm, dissection or hemodynamically significant stenosis. Normal branching pattern SMA: Widely patent without dissection or stenosis. Renals: Single renal arteries bilaterally. No aneurysm, dissection, stenosis or evidence of fibromuscular dysplasia. IMA: Patent without abnormality. Inflow: No aneurysm, stenosis or dissection. Veins: Normal course and caliber of the major veins. Assessment is otherwise limited by the arterial dominant contrast phase. Review of the MIP images confirms the above findings. NON-VASCULAR Hepatobiliary: There is diffuse low density seen throughout the liver parenchyma. A peripherally enhancing lesion seen within the right liver lobe measuring 3.8 cm which is likely hemangioma. No visible biliary dilatation. Normal gallbladder. Pancreas: Normal contours without ductal dilatation. No peripancreatic fluid collection. Spleen: There is a 2 cm low-density lesion seen within the spleen. Adrenals/Urinary Tract: --Adrenal glands: Normal. --kidneys: There is mild right pelviectasis with small calcifications seen within the mid ureter the largest measuring 4 mm. There is also punctate calcifications seen in the upper pole of the right kidney. The left kidney is unremarkable. --Urinary bladder: Unremarkable. Stomach/Bowel: --Stomach/Duodenum: No hiatal hernia or other gastric abnormality. Normal duodenal course and caliber. --Small bowel: No dilatation or inflammation. --Colon: No focal abnormality. --Appendix:  Normal. Lymphatic:  No abdominal or pelvic lymphadenopathy. Reproductive: No free fluid in the pelvis. Calcifications seen within the prostate gland. Musculoskeletal. No bony spinal canal stenosis or focal osseous abnormality. Other: Small fat containing inguinal hernias are present. Review of the MIP images confirms the above findings. IMPRESSION: 1. No acute aortic abnormality. 2. Mild aortic atherosclerosis. Aortic Atherosclerosis (ICD10-I70.0). 3. Small left pleural effusion with adjacent patchy airspace opacity at the left lung base which could be due to pneumonia. 4. Subcutaneous and muscular edema seen along the left lower chest wall. 5. Mild right pelviectasis with mid ureteral calculi. 6. Hepatic steatosis Electronically Signed   By: Jonna Clark M.D.   On: 01/23/2020 03:48    Scheduled Meds: . carvedilol  25 mg Oral BID  . enoxaparin (LOVENOX) injection  40 mg Subcutaneous Q24H  . fluticasone  2 spray Each Nare Daily  . guaiFENesin  600 mg Oral BID  . ipratropium-albuterol  3 mL Nebulization QID  . irbesartan  300 mg Oral Daily  . loratadine  10 mg Oral Daily  . multivitamin with minerals  1 tablet Oral Daily  . polyethylene glycol  17 g Oral Daily  . predniSONE  40 mg Oral Q breakfast  . sertraline  100 mg Oral Daily  . simvastatin  10 mg Oral Daily  . testosterone cypionate  200 mg Intramuscular UD   Continuous Infusions: . sodium chloride 100 mL/hr at 01/23/20 1834     LOS: 1 day   Time spent: 30 minutes   Hughie Closs, MD [facility]  01/24/2020, 10:58 AM   To contact the attending provider between 7A-7P or the covering provider during after hours 7P-7A, please log into the web site [email or web address].

## 2020-01-24 NOTE — Progress Notes (Signed)
Patient has taken leads off. He stated he is unable to sleep with his leads on. Efforts to put them back have proven futile. On call NP [place] notified.

## 2020-01-25 DIAGNOSIS — S20219A Contusion of unspecified front wall of thorax, initial encounter: Secondary | ICD-10-CM

## 2020-01-25 DIAGNOSIS — D62 Acute posthemorrhagic anemia: Secondary | ICD-10-CM

## 2020-01-25 LAB — BASIC METABOLIC PANEL
Anion gap: 9 (ref 5–15)
BUN: 25 mg/dL — ABNORMAL HIGH (ref 6–20)
CO2: 26 mmol/L (ref 22–32)
Calcium: 8.4 mg/dL — ABNORMAL LOW (ref 8.9–10.3)
Chloride: 104 mmol/L (ref 98–111)
Creatinine, Ser: 0.87 mg/dL (ref 0.61–1.24)
GFR, Estimated: >60 mL/min (ref >60)
Glucose, Bld: 88 mg/dL (ref 70–99)
Potassium: 3.9 mmol/L (ref 3.5–5.1)
Sodium: 139 mmol/L (ref 135–145)

## 2020-01-25 LAB — CBC WITH DIFFERENTIAL/PLATELET
Abs Immature Granulocytes: 0.39 10*3/uL — ABNORMAL HIGH (ref 0.00–0.07)
Basophils Absolute: 0 10*3/uL (ref 0.0–0.1)
Basophils Relative: 0 %
Eosinophils Absolute: 0 10*3/uL (ref 0.0–0.5)
Eosinophils Relative: 0 %
HCT: 34.2 % — ABNORMAL LOW (ref 39.0–52.0)
Hemoglobin: 11.3 g/dL — ABNORMAL LOW (ref 13.0–17.0)
Immature Granulocytes: 4 %
Lymphocytes Relative: 13 %
Lymphs Abs: 1.3 10*3/uL (ref 0.7–4.0)
MCH: 32.9 pg (ref 26.0–34.0)
MCHC: 33 g/dL (ref 30.0–36.0)
MCV: 99.7 fL (ref 80.0–100.0)
Monocytes Absolute: 0.8 10*3/uL (ref 0.1–1.0)
Monocytes Relative: 8 %
Neutro Abs: 7.4 10*3/uL (ref 1.7–7.7)
Neutrophils Relative %: 75 %
Platelets: 201 10*3/uL (ref 150–400)
RBC: 3.43 MIL/uL — ABNORMAL LOW (ref 4.22–5.81)
RDW: 12.4 % (ref 11.5–15.5)
WBC: 9.9 10*3/uL (ref 4.0–10.5)
nRBC: 0 % (ref 0.0–0.2)

## 2020-01-25 MED ORDER — OXYCODONE-ACETAMINOPHEN 5-325 MG PO TABS: 1.0000 | ORAL_TABLET | Freq: Three times a day (TID) | ORAL | 0 refills | Status: DC | PRN

## 2020-01-25 MED ORDER — PREDNISONE 50 MG PO TABS: 50.0000 mg | ORAL_TABLET | Freq: Every day | ORAL | 0 refills | Status: AC

## 2020-01-25 NOTE — Discharge Instructions (Signed)
Asthma Attack  Acute bronchospasm caused by asthma is also referred to as an asthma attack. Bronchospasm means that the air passages become narrowed or "tight," which limits the amount of oxygen that can get into the lungs. The narrowing is caused by inflammation and tightening of the muscles in the air tubes (bronchi) in the lungs. Excessive mucus is also produced, which narrows the airways more. This can cause trouble breathing, coughing, and loud breathing (wheezing). What are the causes? Possible triggers include:  Animal dander from the skin, hair, or feathers of animals.  Dust mites contained in house dust.  Cockroaches.  Pollen from trees or grass.  Mold.  Cigarette or tobacco smoke.  Air pollutants such as dust, household cleaners, hair sprays, aerosol sprays, paint fumes, strong chemicals, or strong odors.  Cold air or weather changes. Cold air may trigger inflammation. Winds increase molds and pollens in the air.  Strong emotions such as crying or laughing hard.  Stress.  Certain medicines, such as aspirin or beta-blockers.  Sulfites in foods and drinks, such as dried fruits and wine.  Infections or inflammatory conditions, such as a flu, a cold, pneumonia, or inflammation of the nasal membranes (rhinitis).  Gastroesophageal reflux disease (GERD). GERD is a condition in which stomach acid backs up into your esophagus, which can irritate nearby airway structures.  Exercise or activity that requires a lot of energy. What are the signs or symptoms? Symptoms of this condition include:  Wheezing. This may sound like whistling while breathing. This may be more noticeable at night.  Excessive coughing, particularly at night.  Chest tightness or pain.  Shortness of breath.  Feeling like you cannot get enough air no matter how hard you try (air hunger). How is this diagnosed? This condition may be diagnosed based on:  Your medical history.  Your symptoms.  A  physical exam.  Tests to check for other causes of your symptoms or other conditions that may have triggered your asthma attack. These tests may include: ? Chest X-ray. ? Blood tests. ? Specialized tests to assess lung function, such as breathing into a device that measures how much air you inhale and exhale (spirometry). How is this treated? The goal of treatment is to open the airways in your lungs and reduce inflammation. Most asthma attacks are treated with medicines that you inhale through a hand-held inhaler (metered dose inhaler, MDI) or a device that turns liquid medicine into a mist that you inhale (nebulizer). Medicines may include:  Quick relief or rescue medicines that relax the muscles of the bronchi. These medicines include bronchodilators, such as albuterol.  Controller medicines, such as inhaled corticosteroids. These are long-acting medicines that are used for daily asthma maintenance. If you have a moderate or severe asthma attack, you may be treated with steroid medicines by mouth or through an IV injection at the hospital. Steroid medicines reduce inflammation in your lungs. Depending on the severity of your attack, you may need oxygen therapy to help you breathe. If your asthma attack was caused by a bacterial infection, such as pneumonia, you will be given antibiotic medicines. Follow these instructions at home: Medicines  Take over-the-counter and prescription medicines only as told by your health care provider. Keep your medicines up-to-date and available.  If you are more than [redacted] weeks pregnant and you are prescribed any new medicines, tell your obstetrician about those medicines.  If you were prescribed an antibiotic medicine, take it as told by your health care provider. Do not   stop taking the antibiotic even if you start to feel better. Avoiding triggers   Keep track of things that trigger your asthma attacks or cause you to have breathing problems, and avoid  exposure to these triggers.  Do not use any products that contain nicotine or tobacco, such as cigarettes and e-cigarettes. If you need help quitting, ask your health care provider.  Avoid secondhand smoke.  Avoid strong smells, such as perfumes, aerosols, and cleaning solvents.  When pollen or air pollution is bad, keep windows closed and use an air conditioner or go to places with air conditioning. Asthma action plan  Work with your health care provider to make a written plan for managing and treating your asthma attacks (asthma action plan). This plan should include: ? A list of your asthma triggers and how to avoid them. ? Information about when your medicines should be taken and when their dosage should be changed. ? Instructions about using a device called a peak flow meter to monitor your condition. A peak flow meter measures how well your lungs are working and measures how severe your asthma is at a given time. Your "personal best" is the highest peak flow rate you can reach when you feel good and have no asthma symptoms. General instructions  Avoid excessive exercise or activity until your asthma attack resolves. Ask your health care provider what activities are safe for you and when you can return to your normal activities.  Stay up to date on all vaccinations recommended by your health care provider, such as flu and pneumonia vaccines.  Drink enough fluid to keep your urine clear or pale yellow. Staying hydrated helps keep mucus in your lungs thin so it can be coughed up easily.  If you drink caffeine, do so in moderation.  Do not use alcohol until you have recovered.  Keep all follow-up visits as told by your health care provider. This is important. Asthma requires careful medical care, and you and your health care provider can work together to reduce the likelihood of future attacks. Contact a health care provider if:  Your peak flow reading is still at 50-79% of your  personal best after you have followed your action plan for 1 hour. This is in the yellow zone, which means "caution."  You need to use a reliever medicine more than 2-3 times a week.  Your medicines are causing side effects, such as: ? Rash. ? Itching. ? Swelling. ? Trouble breathing.  Your symptoms do not improve after 48 hours.  You cough up mucus (sputum) that is thicker than usual.  You have a fever.  You need to use your medicines much more frequently than normal. Get help right away if:  Your peak flow reading is less than 50% of your personal best. This is in the red zone, which means "danger."  You have severe trouble breathing.  You develop chest pain or discomfort.  Your medicines no longer seem to be helping.  You vomit.  You cannot eat or drink without vomiting.  You are coughing up yellow, green, brown, or bloody mucus.  You have a fever and your symptoms suddenly get worse.  You have trouble swallowing.  You feel very tired, and breathing becomes tiring. Summary  Acute bronchospasm caused by asthma is also referred to as an asthma attack.  Bronchospasm is caused by narrowing or tightness in air passages, which causes shortness of breath, coughing, and loud breathing (wheezing).  Many things can trigger an asthma   attack, such as allergens, weather changes, exercise, smoke, and other fumes.  Treatment for an asthma attack may include inhaled rescue medicines for immediate relief, as well as the use of maintenance therapy.  Get help right away if you have worsening shortness of breath, chest pain, or fever, or if your home medicines are no longer helping with your symptoms. This information is not intended to replace advice given to you by your health care provider. Make sure you discuss any questions you have with your health care provider. Document Revised: 05/23/2018 Document Reviewed: 03/05/2016 [employer] Patient Education  2020 [employer]  

## 2020-01-25 NOTE — Discharge Summary (Signed)
Physician Discharge Summary  Sandria SenterGregory C Mckinney ZOX:[id number] DOB: 02/19/1965 DOA: 01/23/2020  PCP: Jerl MinaHedrick, James, MD  Admit date: 01/23/2020 Discharge date: 01/25/2020  Admitted From: Home Disposition: Home  Recommendations for Outpatient Follow-up:  1. Follow up with PCP in 1-2 weeks 2. Please obtain BMP/CBC in one week 3. Please follow up with your PCP on the following pending results: Unresulted Labs (From admission, onward)          Start     Ordered   01/23/20 0433  Culture, sputum-assessment  (COPD / Pneumonia )  Once,   STAT        01/23/20 0434           Home Health: None Equipment/Devices: None  Discharge Condition: Stable CODE STATUS: Full code Diet recommendation: Cardiac  Subjective: Seen and examined.  Breathing feels better but still has left chest wall pain due to hematoma.  No other complaint.  Ready to go home.  Brief/Interim Summary: John Mckinney a54 y.o.Caucasian malewith a known history of asthma and hypertension, who presented to the emergency room with acute onset of worsening cough with associated dyspnea and wheezingwhich has been going on for the last week. He tested negative for Covid on an outpatient basis as well as his wife.. He stated that his cough was significant enough that he heard something pop in his left chest. He had an episode of vomiting with associated diaphoresisandpallor.His pain was extending to the left upper quadrant and to the left back with mild swelling. With his vomiting, hedropped his pulse oximetry to 88% on room air as well as his systolic blood pressure to 64.  Upon presentation to the emergency room, respiratory rate was 30 with otherwise normal vital signs. BMP showed blood glucose of 150 and a creatinine of 1.5 with a BUN of 29. Procalcitonin was less than 0.1 CBC showed a leukocytosis of 10.8. COVID-19 PCR came back negative. Influenza antigen Acame back positive andBwas negative. Chest x-ray showed  small left pleural effusion with adjacent streaky airspace opacity that could be related to atelectasis and/or infectious etiology. Chest CTA revealed small left pleural effusion with adjacent patchy airspace opacity of the left lung base that could be due to pneumonia, subcutaneous and muscular edema seen along the left lower chest, mild right pelviectasis with med ureteral calculi, hepatic steatosis, mild aortic atherosclerosis and no acute aortic abnormality.  The patient was given IV Rocephin and Zithromax, 1 L bolus of IV normal saline and 1 L bolus of IV lactated ringer, 2 g of IV magnesium sulfate, IV fentanyl, duo nebs x3 and then continuous albuterol nebulizer and p.o. Tamiflu. he will be admitted to a progressive unit bed for further evaluation and management.   Patient was also started on Tamiflu for influenza A however since patient symptoms started about 7 days before presentation so Tamiflu was discontinued.  He was started on Tamiflu IV Solu-Medrol.  He started getting better.  Upon presentation, he was requiring 2 to 3 L of oxygen.  Within 24 hours, he was weaned down to room air.  He also came in with AKI which resolved after IV hydration.  Nephrotoxic agents were held.  He is left chest wall hematoma expanded a little bit more.  His hemoglobin also dropped almost 2 g but remained over 11 for last 2 days.  He did not require any blood transfusion.  His hematoma has now stabilized and last 24 hours.  He has remained off of oxygen as well.  He still has  very minimal wheezes but he denies any shortness of breath.  He is not hypoxic.  He is being discharged to home in stable condition.  He will be prescribed 3 more days of oral prednisone.  Will follow with PCP and repeat CBC.  Per his request, he has been prescribed 10 tablets of Percocet for his pain due to hematoma.  He has been advised to continue using incentive spirometry and deep breathing at home.  He agrees with the plan of  discharge.  Discharge Diagnoses:  Active Problems:   Influenzal pneumonia   Acute asthma exacerbation   Acute respiratory failure with hypoxia (HCC)   Chest wall hematoma   Acute blood loss anemia    Discharge Instructions   Allergies as of 01/25/2020   No Known Allergies     Medication List    TAKE these medications   albuterol 108 (90 Base) MCG/ACT inhaler Commonly known as: VENTOLIN HFA Inhale 1-2 puffs into the lungs every 6 (six) hours as needed for wheezing or shortness of breath.   aspirin 81 MG chewable tablet Chew 81 mg by mouth daily.   carvedilol 25 MG tablet Commonly known as: COREG Take 25 mg by mouth 2 (two) times daily.   fluticasone 50 MCG/ACT nasal spray Commonly known as: FLONASE Place 2 sprays into both nostrils daily.   loratadine 10 MG tablet Commonly known as: CLARITIN Take 10 mg by mouth daily.   oxyCODONE-acetaminophen 5-325 MG tablet Commonly known as: PERCOCET/ROXICET Take 1 tablet by mouth every 8 (eight) hours as needed for moderate pain or severe pain.   predniSONE 50 MG tablet Commonly known as: DELTASONE Take 1 tablet (50 mg total) by mouth daily with breakfast for 3 days.   sertraline 100 MG tablet Commonly known as: ZOLOFT Take 100 mg by mouth daily.   simvastatin 10 MG tablet Commonly known as: ZOCOR Take 10 mg by mouth daily.   testosterone cypionate 200 MG/ML injection Commonly known as: DEPOTESTOSTERONE CYPIONATE Inject 200 mg into the muscle as directed.   triamterene-hydrochlorothiazide 37.5-25 MG tablet Commonly known as: MAXZIDE-25 Take 1 tablet by mouth daily.   valsartan 320 MG tablet Commonly known as: DIOVAN Take 320 mg by mouth daily.       Follow-up Information    Jerl Mina, MD Follow up in 1 week(s).   Specialty: Family Medicine Contact information: [address] [facility] [place] [address] [phone]              No Known Allergies  Consultations:  None  Procedures/Studies: DG Chest Port 1 View  Result Date: 01/23/2020 CLINICAL DATA:  Left-sided chest EXAM: PORTABLE CHEST 1 VIEW COMPARISON:  None. FINDINGS: The heart size and mediastinal contours are within normal limits. Streaky airspace opacity seen at the left lung base. There appears to be blunting of the left costophrenic angle which could be a small left pleural effusion. The visualized skeletal structures are unremarkable. IMPRESSION: Small left pleural effusion with adjacent streaky airspace opacity which could be due to atelectasis and/or infectious etiology. Electronically Signed   By: Jonna Clark M.D.   On: 01/23/2020 02:36   CT Angio Chest/Abd/Pel for Dissection W and/or W/WO  Result Date: 01/23/2020 CLINICAL DATA:  Left chest wall pain EXAM: CT ANGIOGRAPHY CHEST, ABDOMEN AND PELVIS TECHNIQUE: Non-contrast CT of the chest was initially obtained. Multidetector CT imaging through the chest, abdomen and pelvis was performed using the standard protocol during bolus administration of intravenous contrast. Multiplanar reconstructed images and  MIPs were obtained and reviewed to evaluate the vascular anatomy. CONTRAST:  OMNIPAQUE IOHEXOL 350 MG/ML SOLN COMPARISON:  Radiograph same day FINDINGS: CTA CHEST FINDINGS Cardiovascular: --Heart: The heart size is normal.  There is nopericardial effusion. --Aorta: The course and caliber of the thoracic aorta are normal. There is no aortic atherosclerotic calcification. Precontrast images show no aortic intramural hematoma. There is no blood pool, dissection or penetrating ulcer demonstrated on arterial phase postcontrast imaging. There is a conventional 3 vessel aortic arch branching pattern. The proximal arch vessels are widely patent. --Pulmonary Arteries: Contrast timing is optimized for preferential opacification of the aorta. Within that limitation, normal central pulmonary arteries. Mediastinum/Nodes: No mediastinal, hilar or axillary  lymphadenopathy. The visualized thyroid and thoracic esophageal course are unremarkable. Lungs/Pleura: There is a small left pleural effusion present. Patchy airspace opacity is seen at the left lung base. The right lung is clear. Musculoskeletal: There is subcutaneous edema with edema in the external oblique musculature overlying the left lower chest wall. Review of the MIP images confirms the above findings. CTA ABDOMEN AND PELVIS FINDINGS VASCULAR Aorta: Normal caliber aorta without aneurysm, dissection, vasculitis or hemodynamically significant stenosis. There is scattered mild aortic atherosclerosis. Celiac: No aneurysm, dissection or hemodynamically significant stenosis. Normal branching pattern SMA: Widely patent without dissection or stenosis. Renals: Single renal arteries bilaterally. No aneurysm, dissection, stenosis or evidence of fibromuscular dysplasia. IMA: Patent without abnormality. Inflow: No aneurysm, stenosis or dissection. Veins: Normal course and caliber of the major veins. Assessment is otherwise limited by the arterial dominant contrast phase. Review of the MIP images confirms the above findings. NON-VASCULAR Hepatobiliary: There is diffuse low density seen throughout the liver parenchyma. A peripherally enhancing lesion seen within the right liver lobe measuring 3.8 cm which is likely hemangioma. No visible biliary dilatation. Normal gallbladder. Pancreas: Normal contours without ductal dilatation. No peripancreatic fluid collection. Spleen: There is a 2 cm low-density lesion seen within the spleen. Adrenals/Urinary Tract: --Adrenal glands: Normal. --kidneys: There is mild right pelviectasis with small calcifications seen within the mid ureter the largest measuring 4 mm. There is also punctate calcifications seen in the upper pole of the right kidney. The left kidney is unremarkable. --Urinary bladder: Unremarkable. Stomach/Bowel: --Stomach/Duodenum: No hiatal hernia or other gastric  abnormality. Normal duodenal course and caliber. --Small bowel: No dilatation or inflammation. --Colon: No focal abnormality. --Appendix: Normal. Lymphatic:  No abdominal or pelvic lymphadenopathy. Reproductive: No free fluid in the pelvis. Calcifications seen within the prostate gland. Musculoskeletal. No bony spinal canal stenosis or focal osseous abnormality. Other: Small fat containing inguinal hernias are present. Review of the MIP images confirms the above findings. IMPRESSION: 1. No acute aortic abnormality. 2. Mild aortic atherosclerosis. Aortic Atherosclerosis (ICD10-I70.0). 3. Small left pleural effusion with adjacent patchy airspace opacity at the left lung base which could be due to pneumonia. 4. Subcutaneous and muscular edema seen along the left lower chest wall. 5. Mild right pelviectasis with mid ureteral calculi. 6. Hepatic steatosis Electronically Signed   By: Jonna Clark M.D.   On: 01/23/2020 03:48      Discharge Exam: Vitals:   01/25/20 0754 01/25/20 0915  BP:  (!) 166/100  Pulse:  88  Resp:  18  Temp:  (!) 97.5 F (36.4 C)  SpO2: 98% 100%   Vitals:   01/25/20 0046 01/25/20 0540 01/25/20 0754 01/25/20 0915  BP: 134/80 (!) 147/101  (!) 166/100  Pulse: 93 87  88  Resp: Temp: 97.8 F (  36.6 C) (!) 97.4 F (36.3 C)  (!) 97.5 F (36.4 C)  TempSrc: Oral     SpO2: 97% 99% 98% 100%  Weight:      Height:        General: Pt is alert, awake, not in acute distress Cardiovascular: RRR, S1/S2 +, no rubs, no gallops Respiratory: CTA bilaterally, minimal expiratory wheezes, no rhonchi, left chest wall hematoma which is slightly tender to palpation. Abdominal: Soft, NT, ND, bowel sounds + Extremities: no edema, no cyanosis    The results of significant diagnostics from this hospitalization (including imaging, microbiology, ancillary and laboratory) are listed below for reference.     Microbiology: Recent Results (from the past 240 hour(s))  Resp Panel by  RT-PCR (Flu A&B, Covid) Nasopharyngeal Swab     Status: Abnormal   Collection Time: 01/23/20  2:13 AM   Specimen: Nasopharyngeal Swab; Nasopharyngeal(NP) swabs in vial transport medium  Result Value Ref Range Status   SARS Coronavirus 2 by RT PCR NEGATIVE NEGATIVE Final    Comment: (NOTE) SARS-CoV-2 target nucleic acids are NOT DETECTED.  The SARS-CoV-2 RNA is generally detectable in upper respiratory specimens during the acute phase of infection. The lowest concentration of SARS-CoV-2 viral copies this assay can detect is 138 copies/mL. A negative result does not preclude SARS-Cov-2 infection and should not be used as the sole basis for treatment or other patient management decisions. A negative result may occur with  improper specimen collection/handling, submission of specimen other than nasopharyngeal swab, presence of viral mutation(s) within the areas targeted by this assay, and inadequate number of viral copies(<138 copies/mL). A negative result must be combined with clinical observations, patient history, and epidemiological information. The expected result is Negative.  Fact Sheet for Patients:  BloggerCourse.com  Fact Sheet for Healthcare Providers:  SeriousBroker.it  This test is no t yet approved or cleared by the [place] FDA and  has been authorized for detection and/or diagnosis of SARS-CoV-2 by FDA under an Emergency Use Authorization (EUA). This EUA will remain  in effect (meaning this test can be used) for the duration of the COVID-19 declaration under Section 564(b)(1) of the Act, 21 U.S.C.section 360bbb-3(b)(1), unless the authorization is terminated  or revoked sooner.       Influenza A by PCR POSITIVE (A) NEGATIVE Final   Influenza B by PCR NEGATIVE NEGATIVE Final    Comment: (NOTE) The Xpert Xpress SARS-CoV-2/FLU/RSV plus assay is intended as an aid in the diagnosis of influenza from Nasopharyngeal  swab specimens and should not be used as a sole basis for treatment. Nasal washings and aspirates are unacceptable for Xpert Xpress SARS-CoV-2/FLU/RSV testing.  Fact Sheet for Patients: BloggerCourse.com  Fact Sheet for Healthcare Providers: SeriousBroker.it  This test is not yet approved or cleared by the [place] FDA and has been authorized for detection and/or diagnosis of SARS-CoV-2 by FDA under an Emergency Use Authorization (EUA). This EUA will remain in effect (meaning this test can be used) for the duration of the COVID-19 declaration under Section 564(b)(1) of the Act, 21 U.S.C. section 360bbb-3(b)(1), unless the authorization is terminated or revoked.  Performed at [facility], [address] [place], [place] [address]      Labs: BNP (last 3 results) No results for input(s): BNP in the last 8760 hours. Basic Metabolic Panel: Recent Labs  Lab 01/23/20 0208 01/23/20 0459 01/24/20 0454 01/25/20 0607  NA 137 139 137 139  K 3.9 4.4 4.3 3.9  CL 101 105 104 104  CO2 24 25 24 26   GLUCOSE 150* 125* 194* 88  BUN 29* 30* 23* 25*  CREATININE 1.50* 1.05 0.84 0.87  CALCIUM 8.9 8.4* 8.6* 8.4*  MG  --   --  2.3  --    Liver Function Tests: No results for input(s): AST, ALT, ALKPHOS, BILITOT, PROT, ALBUMIN in the last 168 hours. No results for input(s): LIPASE, AMYLASE in the last 168 hours. No results for input(s): AMMONIA in the last 168 hours. CBC: Recent Labs  Lab 01/23/20 0212 01/23/20 0459 01/24/20 0454 01/24/20 1404 01/25/20 0607  WBC 10.8* 11.7* 15.3*  --  9.9  NEUTROABS 7.1  --  13.5*  --  7.4  HGB 14.0 13.0 11.9* 11.7* 11.3*  HCT 40.7 38.2* 34.6* 34.7* 34.2*  MCV 96.7 97.0 96.6  --  99.7  PLT 246 152 195  --  201   Cardiac Enzymes: No results for input(s): CKTOTAL, CKMB, CKMBINDEX, TROPONINI in the last 168 hours. BNP: Invalid input(s): POCBNP CBG: No results for input(s): GLUCAP  in the last 168 hours. D-Dimer No results for input(s): DDIMER in the last 72 hours. Hgb A1c No results for input(s): HGBA1C in the last 72 hours. Lipid Profile No results for input(s): CHOL, HDL, LDLCALC, TRIG, CHOLHDL, LDLDIRECT in the last 72 hours. Thyroid function studies No results for input(s): TSH, T4TOTAL, T3FREE, THYROIDAB in the last 72 hours.  Invalid input(s): FREET3 Anemia work up Recent Labs    01/24/20 0454  VITAMINB12 222  FOLATE 13.5  FERRITIN 265  TIBC 307  IRON 68  RETICCTPCT 2.2   Urinalysis    Component Value Date/Time   COLORURINE Yellow 04/12/2013 1651   APPEARANCEUR Clear 04/12/2013 1651   LABSPEC 1.016 04/12/2013 1651   PHURINE 7.0 04/12/2013 1651   GLUCOSEU Negative 04/12/2013 1651   HGBUR Negative 04/12/2013 1651   BILIRUBINUR Negative 04/12/2013 1651   KETONESUR Negative 04/12/2013 1651   PROTEINUR Negative 04/12/2013 1651   NITRITE Negative 04/12/2013 1651   LEUKOCYTESUR Negative 04/12/2013 1651   Sepsis Labs Invalid input(s): PROCALCITONIN,  WBC,  LACTICIDVEN Microbiology Recent Results (from the past 240 hour(s))  Resp Panel by RT-PCR (Flu A&B, Covid) Nasopharyngeal Swab     Status: Abnormal   Collection Time: 01/23/20  2:13 AM   Specimen: Nasopharyngeal Swab; Nasopharyngeal(NP) swabs in vial transport medium  Result Value Ref Range Status   SARS Coronavirus 2 by RT PCR NEGATIVE NEGATIVE Final    Comment: (NOTE) SARS-CoV-2 target nucleic acids are NOT DETECTED.  The SARS-CoV-2 RNA is generally detectable in upper respiratory specimens during the acute phase of infection. The lowest concentration of SARS-CoV-2 viral copies this assay can detect is 138 copies/mL. A negative result does not preclude SARS-Cov-2 infection and should not be used as the sole basis for treatment or other patient management decisions. A negative result may occur with  improper specimen collection/handling, submission of specimen other than nasopharyngeal  swab, presence of viral mutation(s) within the areas targeted by this assay, and inadequate number of viral copies(<138 copies/mL). A negative result must be combined with clinical observations, patient history, and epidemiological information. The expected result is Negative.  Fact Sheet for Patients:  14/08/21  Fact Sheet for Healthcare Providers:  BloggerCourse.com  This test is no t yet approved or cleared by the [place] FDA and  has been authorized for detection and/or diagnosis of SARS-CoV-2 by FDA under an Emergency Use Authorization (EUA). This EUA will remain  in effect (meaning this test can be used)  for the duration of the COVID-19 declaration under Section 564(b)(1) of the Act, 21 U.S.C.section 360bbb-3(b)(1), unless the authorization is terminated  or revoked sooner.       Influenza A by PCR POSITIVE (A) NEGATIVE Final   Influenza B by PCR NEGATIVE NEGATIVE Final    Comment: (NOTE) The Xpert Xpress SARS-CoV-2/FLU/RSV plus assay is intended as an aid in the diagnosis of influenza from Nasopharyngeal swab specimens and should not be used as a sole basis for treatment. Nasal washings and aspirates are unacceptable for Xpert Xpress SARS-CoV-2/FLU/RSV testing.  Fact Sheet for Patients: BloggerCourse.com  Fact Sheet for Healthcare Providers: SeriousBroker.it  This test is not yet approved or cleared by the [place] FDA and has been authorized for detection and/or diagnosis of SARS-CoV-2 by FDA under an Emergency Use Authorization (EUA). This EUA will remain in effect (meaning this test can be used) for the duration of the COVID-19 declaration under Section 564(b)(1) of the Act, 21 U.S.C. section 360bbb-3(b)(1), unless the authorization is terminated or revoked.  Performed at [facility], [address] [place], [place] [address]       Time coordinating discharge: Over 30 minutes  SIGNED:   Hughie Closs, MD  [facility] 01/25/2020, 10:40 AM  If 7PM-7AM, please contact night-coverage [email or web address]

## 2020-01-25 NOTE — Plan of Care (Signed)

## 2020-01-25 NOTE — Progress Notes (Signed)
Patient discharged via w/c with volunteer.  AVS given to patient, [place] [facility] [place] to get patient.

## 2020-12-24 HISTORY — PX: COLONOSCOPY: SHX174

## 2022-07-08 IMAGING — DX DG CHEST 1V PORT
1 series · 1 of 1 positions shown · non-contrast
Comparison: None.

CLINICAL DATA: Left-sided chest

EXAM:
PORTABLE CHEST 1 VIEW

[chest ap]
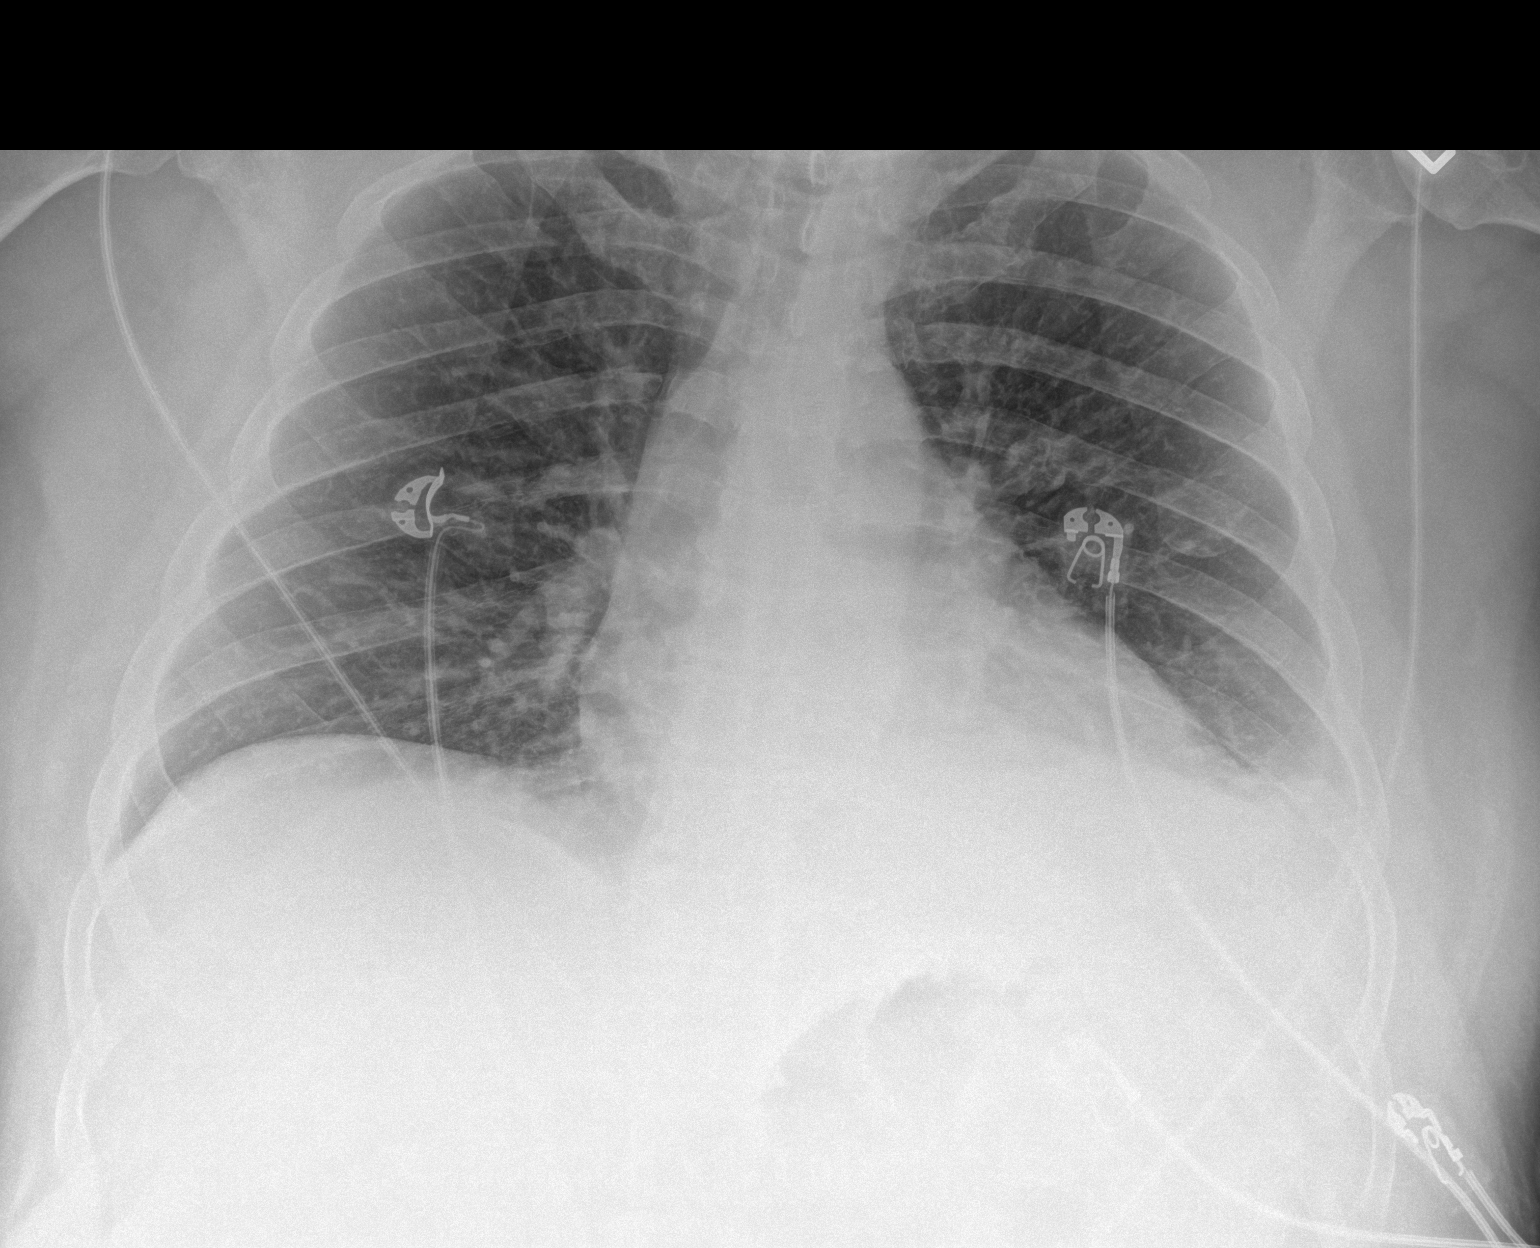

[1 of 1 positions shown; findings below may reference images not displayed]

FINDINGS: The heart size and mediastinal contours are within normal limits.
Streaky airspace opacity seen at the left lung base. There appears
to be blunting of the left costophrenic angle which could be a small
left pleural effusion. The visualized skeletal structures are
unremarkable.
IMPRESSION: Small left pleural effusion with adjacent streaky airspace opacity
which could be due to atelectasis and/or infectious etiology.

## 2022-07-08 IMAGING — CT CT ANGIO CHEST-ABD-PELV FOR DISSECTION W/ AND WO/W CM
2 of 7 series · 14 of 46 positions shown, 16 images · IV contrast (APPLIED)
Comparison: Radiograph same day

CLINICAL DATA: Left chest wall pain

EXAM:
CT ANGIOGRAPHY CHEST, ABDOMEN AND PELVIS
TECHNIQUE: Non-contrast CT of the chest was initially obtained.

[Series 5: axial arterial · axial · arterial · 0.85mm/px · z∈[-1023,-483]mm · 11 of 208 slices shown, 13 images]
[im 14/208  soft-tissue]
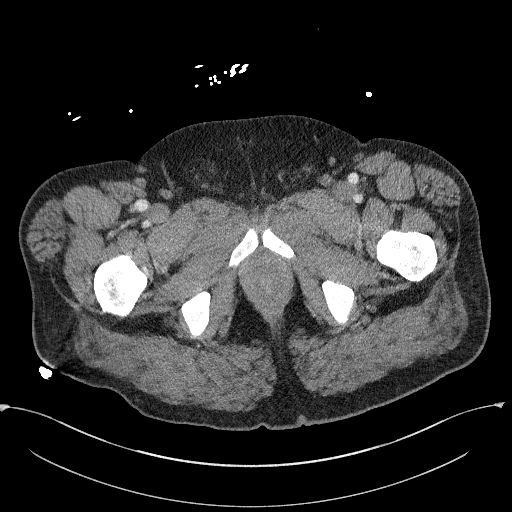
[im 14/208  bone]
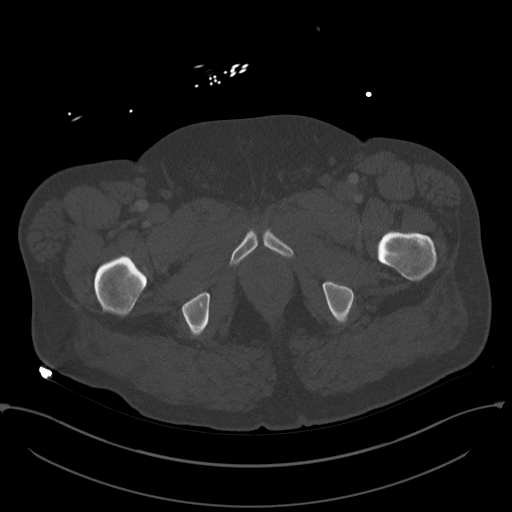
[im 28/208  soft-tissue]
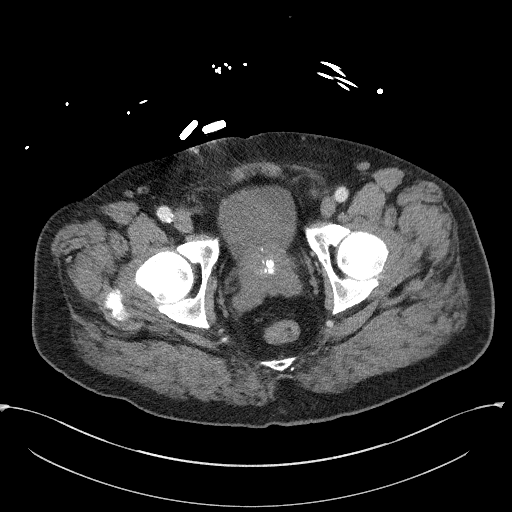
[im 56/208  soft-tissue]
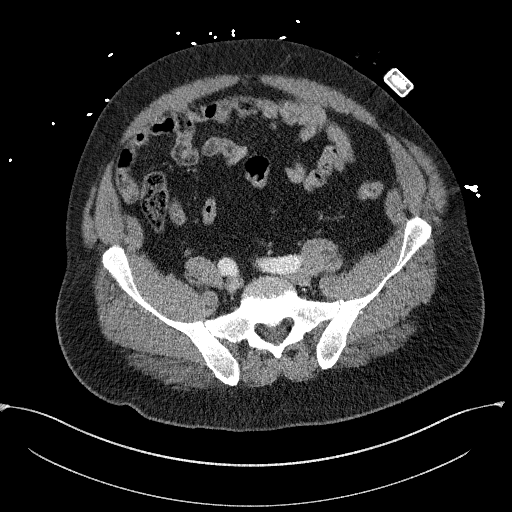
[im 70/208  soft-tissue]
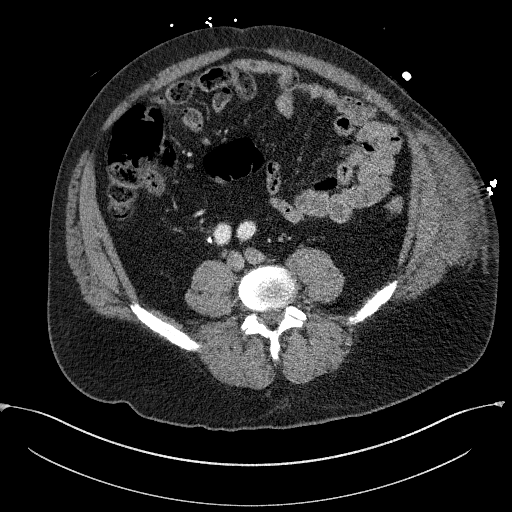
[im 83/208  soft-tissue]
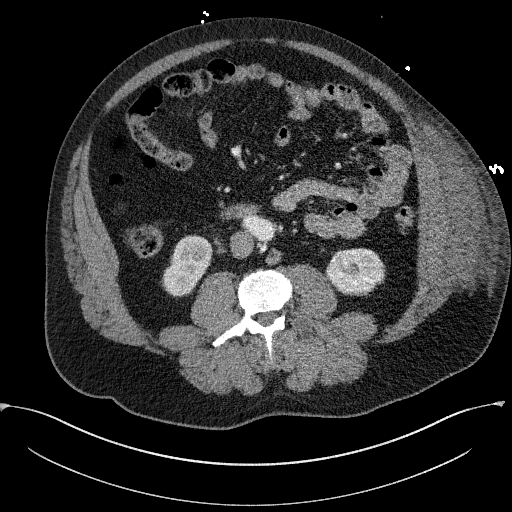
[im 111/208  soft-tissue]
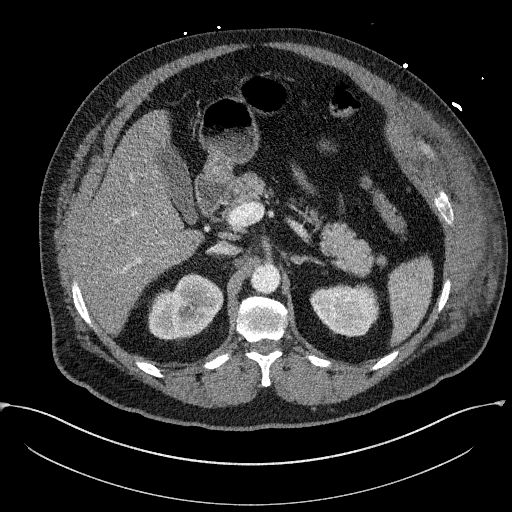
[im 125/208  soft-tissue]
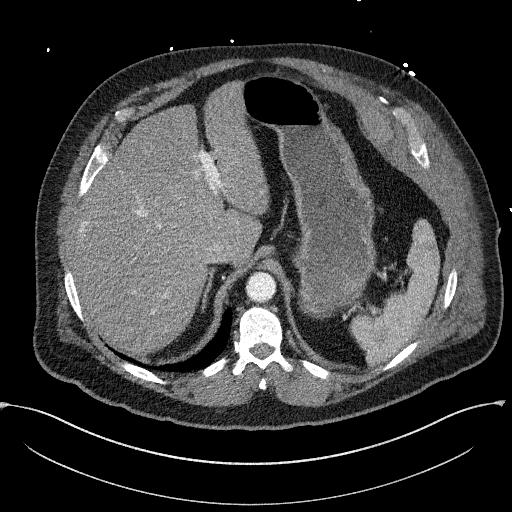
[im 139/208  soft-tissue]
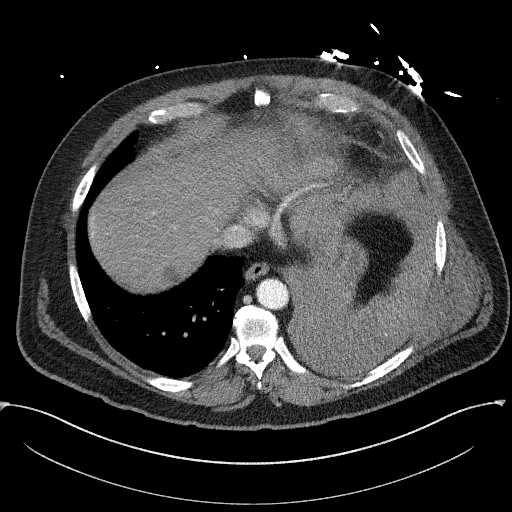
[im 152/208  soft-tissue]
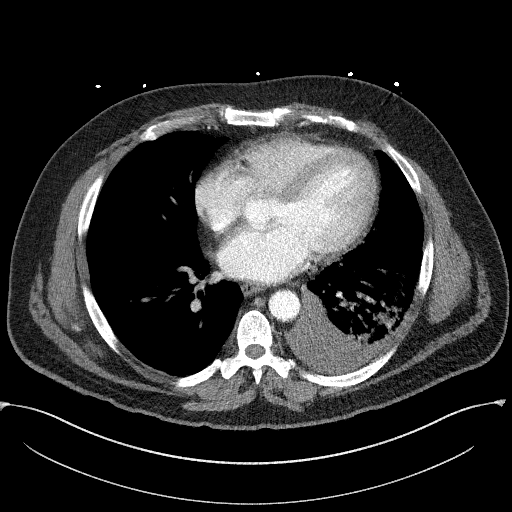
[im 152/208  bone]
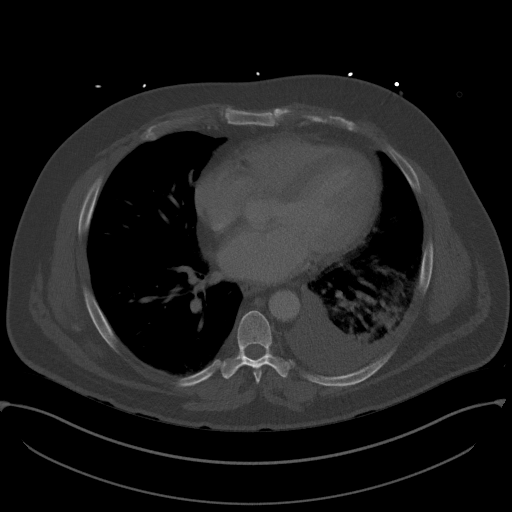
[im 180/208  soft-tissue]
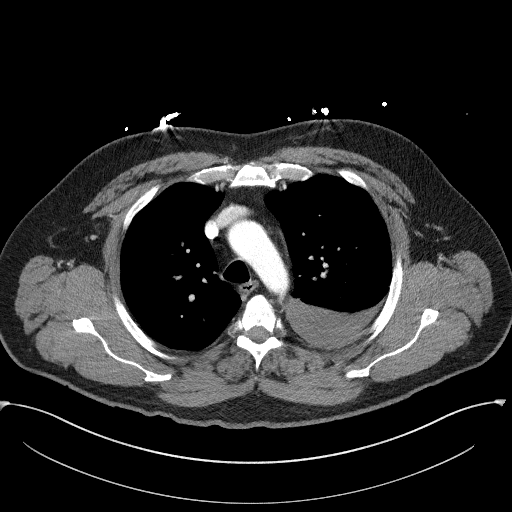
[im 194/208  soft-tissue]
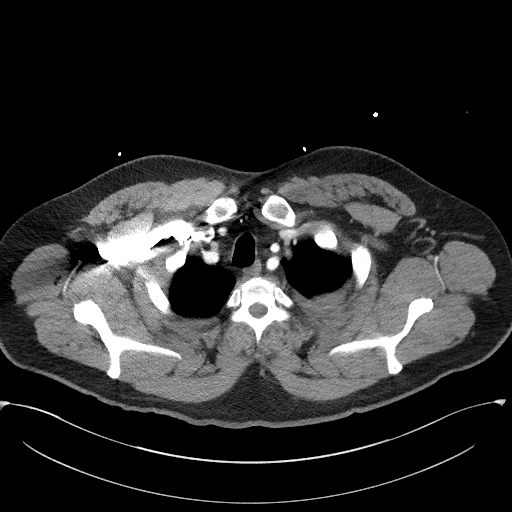

[Series 8: coronals · coronal · 0.92mm/px · 3 of 183 slices shown]
[im 46/183  soft-tissue]
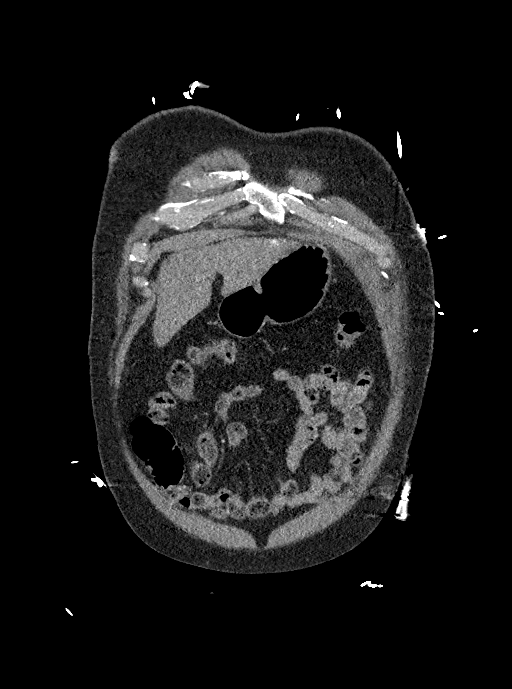
[im 92/183  soft-tissue]
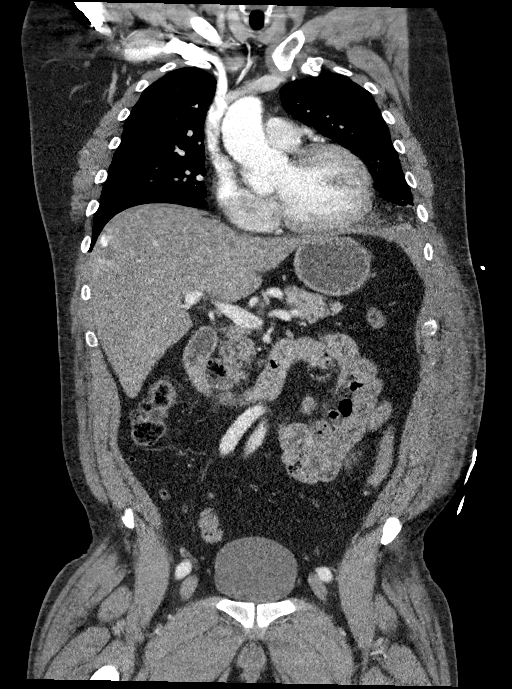
[im 137/183  soft-tissue]
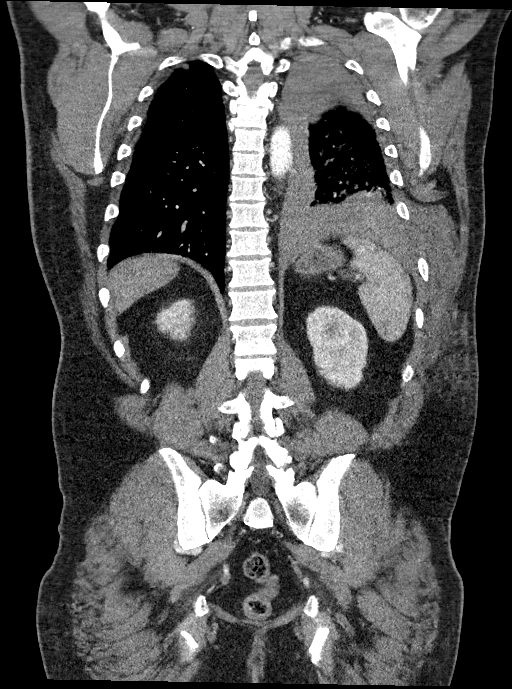

[14 of 46 positions shown; findings below may reference images not displayed]

Multidetector CT imaging through the chest, abdomen and pelvis was
performed using the standard protocol during bolus administration of
intravenous contrast. Multiplanar reconstructed images and MIPs were
obtained and reviewed to evaluate the vascular anatomy.

CONTRAST:  100mL OMNIPAQUE IOHEXOL 350 MG/ML SOLN
FINDINGS: CTA CHEST FINDINGS

Cardiovascular:

--Heart: The heart size is normal.  There is nopericardial effusion.

--Aorta: The course and caliber of the thoracic aorta are normal.
There is no aortic atherosclerotic calcification. Precontrast images
show no aortic intramural hematoma. There is no blood pool,
dissection or penetrating ulcer demonstrated on arterial phase
postcontrast imaging. There is a conventional 3 vessel aortic arch
branching pattern. The proximal arch vessels are widely patent.

--Pulmonary Arteries: Contrast timing is optimized for preferential
opacification of the aorta. Within that limitation, normal central
pulmonary arteries.

Mediastinum/Nodes: No mediastinal, hilar or axillary
lymphadenopathy. The visualized thyroid and thoracic esophageal
course are unremarkable.

Lungs/Pleura: There is a small left pleural effusion present. Patchy
airspace opacity is seen at the left lung base. The right lung is
clear.

Musculoskeletal: There is subcutaneous edema with edema in the
external oblique musculature overlying the left lower chest wall.

Review of the MIP images confirms the above findings.

CTA ABDOMEN AND PELVIS FINDINGS

VASCULAR

Aorta: Normal caliber aorta without aneurysm, dissection, vasculitis
or hemodynamically significant stenosis. There is scattered mild
aortic atherosclerosis.

Celiac: No aneurysm, dissection or hemodynamically significant
stenosis. Normal branching pattern

SMA: Widely patent without dissection or stenosis.

Renals: Single renal arteries bilaterally. No aneurysm, dissection,
stenosis or evidence of fibromuscular dysplasia.

IMA: Patent without abnormality.

Inflow: No aneurysm, stenosis or dissection.

Veins: Normal course and caliber of the major veins. Assessment is
otherwise limited by the arterial dominant contrast phase.

Review of the MIP images confirms the above findings.

NON-VASCULAR

Hepatobiliary: There is diffuse low density seen throughout the
liver parenchyma. A peripherally enhancing lesion seen within the
right liver lobe measuring 3.8 cm which is likely hemangioma. No
visible biliary dilatation. Normal gallbladder.

Pancreas: Normal contours without ductal dilatation. No
peripancreatic fluid collection.

Spleen: There is a 2 cm low-density lesion seen within the spleen.

Adrenals/Urinary Tract:

--Adrenal glands: Normal.

--kidneys: There is mild right pelviectasis with small
calcifications seen within the mid ureter the largest measuring 4
mm. There is also punctate calcifications seen in the upper pole of
the right kidney. The left kidney is unremarkable.

--Urinary bladder: Unremarkable.

Stomach/Bowel:

--Stomach/Duodenum: No hiatal hernia or other gastric abnormality.
Normal duodenal course and caliber.

--Small bowel: No dilatation or inflammation.

--Colon: No focal abnormality.

--Appendix: Normal.

Lymphatic:  No abdominal or pelvic lymphadenopathy.

Reproductive: No free fluid in the pelvis. Calcifications seen
within the prostate gland.

Musculoskeletal. No bony spinal canal stenosis or focal osseous
abnormality.

Other: Small fat containing inguinal hernias are present.

Review of the MIP images confirms the above findings.
IMPRESSION: 1. No acute aortic abnormality.
2. Mild aortic atherosclerosis. Aortic Atherosclerosis
([id number]).
3. Small left pleural effusion with adjacent patchy airspace opacity
at the left lung base which could be due to pneumonia.
4. Subcutaneous and muscular edema seen along the left lower chest
wall.
5. Mild right pelviectasis with mid ureteral calculi.
6. Hepatic steatosis

## 2022-08-03 ENCOUNTER — Other Ambulatory Visit: Payer: Self-pay

## 2022-08-03 ENCOUNTER — Other Ambulatory Visit
Admission: RE | Admit: 2022-08-03 | Discharge: 2022-08-03 | Disposition: A | Discharge status: Home / Self Care | Payer: BC Managed Care – PPO | Attending: Urology | Admitting: Urology

## 2022-08-03 ENCOUNTER — Encounter: Payer: Self-pay | Admitting: Urology

## 2022-08-03 ENCOUNTER — Ambulatory Visit: Payer: BC Managed Care – PPO | Admitting: Urology

## 2022-08-03 VITALS — BP 164/110 | HR 84 | Ht 64.0 in | Wt 197.0 lb

## 2022-08-03 DIAGNOSIS — N401 Enlarged prostate with lower urinary tract symptoms: Secondary | ICD-10-CM | POA: Diagnosis present

## 2022-08-03 DIAGNOSIS — R350 Frequency of micturition: Secondary | ICD-10-CM | POA: Diagnosis not present

## 2022-08-03 DIAGNOSIS — R3129 Other microscopic hematuria: Secondary | ICD-10-CM | POA: Diagnosis not present

## 2022-08-03 DIAGNOSIS — R351 Nocturia: Secondary | ICD-10-CM

## 2022-08-03 DIAGNOSIS — N138 Other obstructive and reflux uropathy: Secondary | ICD-10-CM | POA: Insufficient documentation

## 2022-08-03 DIAGNOSIS — Z125 Encounter for screening for malignant neoplasm of prostate: Secondary | ICD-10-CM

## 2022-08-03 DIAGNOSIS — R3912 Poor urinary stream: Secondary | ICD-10-CM | POA: Diagnosis not present

## 2022-08-03 LAB — URINALYSIS, COMPLETE (UACMP) WITH MICROSCOPIC
Bilirubin Urine: NEGATIVE
Glucose, UA: NEGATIVE mg/dL
Hgb urine dipstick: NEGATIVE
Ketones, ur: NEGATIVE mg/dL
Leukocytes,Ua: NEGATIVE
Nitrite: NEGATIVE
Protein, ur: NEGATIVE mg/dL
Specific Gravity, Urine: 1.015 (ref 1.005–1.030)
WBC, UA: NONE SEEN WBC/hpf (ref 0–5)
pH: 7 (ref 5.0–8.0)

## 2022-08-03 LAB — BLADDER SCAN AMB NON-IMAGING

## 2022-08-03 NOTE — Patient Instructions (Signed)

## 2022-08-03 NOTE — Progress Notes (Signed)
   08/03/22 4:13 PM   John Mckinney 1965/05/11 [id number]  CC: Urinary symptoms, microscopic hematuria  HPI: 57 year old male with long history of testosterone replacement through PCP for at least 10 years who made an appointment today to discuss worsening urinary symptoms, as well as new microscopic hematuria.  He reports at least a few months of worsening symptoms with urgency, frequency, occasional dysuria, nocturia 4-5 times overnight.  He has never been on medications for his prostate before, and wants to avoid medications in general.  He reports a history of recurrent prostatitis when he was in his 30s and 62s.  IPSS score today is 25, with quality-of-life mostly dissatisfied, PVR is normal at 70ml.  Urinalysis today is benign with no microscopic hematuria.  PSA normal from May 2024 at 2.26, normal renal function with creatinine 0.9, EGFR greater than 60.   PMH: Past Medical History:  Diagnosis Date   Asthma    History of adenomatous polyp of colon 12/25/2012   Hypertension 12/25/2012    Surgical History: Past Surgical History:  Procedure Laterality Date   KNEE ARTHROSCOPY W/ ACL RECONSTRUCTION  1998   ACL Reconstruction   OSTEOTOMY AND ULNAR SHORTENING     Ulnar and Radial fracture   TONSILLECTOMY Bilateral age 53    Family History: Family History  Problem Relation Age of Onset   Hypertension Mother    Hyperlipidemia Father    Hypertension Father    Colon cancer Brother    Hyperlipidemia Maternal Grandfather    Hyperlipidemia Paternal Grandmother    Hyperlipidemia Paternal Grandfather     Social History:  reports that he has quit smoking. He has quit using smokeless tobacco. He reports that he does not drink alcohol and does not use drugs.  Physical Exam: BP (!) 164/110 (BP Location: Left Arm, Patient Position: Sitting, Cuff Size: Large)   Pulse 84   Ht 5\' 4"  (1.626 m)   Wt 197 lb (89.4 kg)   BMI 33.81 kg/m    Constitutional:  Alert and oriented, No acute  distress. Cardiovascular: No clubbing, cyanosis, or edema. Respiratory: Normal respiratory effort, no increased work of breathing. GI: Abdomen is soft, nontender, nondistended, no abdominal masses   Laboratory Data: Reviewed, see HPI  Pertinent Imaging: I have personally viewed and interpreted the CT abdomen and pelvis from December 2021 showing multiple small right mid ureteral stones with mild right hydronephrosis.  Assessment & Plan:   57 year old male with worsening urinary symptoms of frequency, weak stream, nocturia 4-5 times at night of unclear etiology.  Possible etiologies include BPH, urethral stricture, OAB, bladder/prostate lesions.  PSA normal, PVR normal, recent microscopic hematuria.  He is not interested in trial of any medications, he is interested in [facility].  We discussed common possible etiologies of microscopic hematuria including idiopathic, urolithiasis, medical renal disease, and malignancy. We discussed the new asymptomatic microscopic hematuria guidelines and risk categories of low, intermediate, and high risk that are based on age, risk factors like smoking, and degree of microscopic hematuria. We discussed work-up can range from repeat urinalysis, renal ultrasound and cystoscopy, to CT urogram and cystoscopy.  They fall into the high risk category, and I recommended proceeding with CT and cystoscopy for both further evaluation of microscopic hematuria as well as his urinary symptoms and consideration of outlet procedures  Legrand Rams, MD 08/03/2022  [facility] [address] [place], [place] [address] [phone]

## 2022-08-11 ENCOUNTER — Ambulatory Visit: Payer: BC Managed Care – PPO

## 2022-08-16 ENCOUNTER — Ambulatory Visit: Admission: RE | Admit: 2022-08-16 | Payer: BC Managed Care – PPO | Source: Ambulatory Visit

## 2022-08-31 ENCOUNTER — Other Ambulatory Visit: Payer: No Typology Code available for payment source | Admitting: Urology

## 2023-03-04 ENCOUNTER — Encounter: Payer: Self-pay | Admitting: Orthopedic Surgery

## 2023-03-04 NOTE — Progress Notes (Unsigned)
Referring Physician:  Jerl Mina, MD [address] [place],  [place] [address]  Primary Physician:  Jerl Mina, MD  History of Present Illness: 03/07/2023 Mr. Delshon Kanhai has a history of HTN, high cholesterol, hypogonadism.   He is a retired Adult nurse.   He has constant upper to mid thoracic pain between his shoulder blades x years that varies in severity. He notes some intermittent spasms into his ribs on both sides. He has constant numbness/tingling around to his chest at his nipples x 6-12 months. No radiation of pain to arms or legs. No weakness in his arms or legs. He has increased pain with lifting- took care of his mother for years and had to lift her. She recently passed away.   He is taking mobic.   Bowel/Bladder Dysfunction: none  He smokes about 1/2ppd. He restarted 2 years ago.    Conservative measures:  Physical therapy: has not participated in PT  Multimodal medical therapy including regular antiinflammatories: Meloxicam Injections: no epidural steroid injections  Past Surgery: none  Sandria Senter has no symptoms of cervical myelopathy.  The symptoms are causing a significant impact on the patient's life.   Review of Systems:  A 10 point review of systems is negative, except for the pertinent positives and negatives detailed in the HPI.  Past Medical History: Past Medical History:  Diagnosis Date   Asthma    History of adenomatous polyp of colon 12/25/2012   Hyperlipidemia    Hypertension 12/25/2012   Hypogonadism in male     Past Surgical History: Past Surgical History:  Procedure Laterality Date   CLOSED REDUCTION WRIST FRACTURE     pt has "screw and plate" from surgery   COLONOSCOPY  12/24/2020   KNEE ARTHROSCOPY W/ ACL RECONSTRUCTION  02/16/1996   ACL Reconstruction   OSTEOTOMY AND ULNAR SHORTENING     Ulnar and Radial fracture   TONSILLECTOMY Bilateral age 83    Allergies: Allergies as of  03/07/2023   (No Known Allergies)    Medications: Outpatient Encounter Medications as of 03/07/2023  Medication Sig   albuterol (VENTOLIN HFA) 108 (90 Base) MCG/ACT inhaler Inhale 1-2 puffs into the lungs every 6 (six) hours as needed for wheezing or shortness of breath.   carvedilol (COREG) 25 MG tablet Take 25 mg by mouth 2 (two) times daily.   fluticasone (FLONASE) 50 MCG/ACT nasal spray Place 2 sprays into both nostrils daily.   loratadine (CLARITIN) 10 MG tablet Take 10 mg by mouth daily.   meloxicam (MOBIC) 15 MG tablet Take 1 tablet by mouth daily.   sertraline (ZOLOFT) 100 MG tablet Take 100 mg by mouth daily.   simvastatin (ZOCOR) 10 MG tablet Take 10 mg by mouth daily.   testosterone cypionate (DEPOTESTOSTERONE CYPIONATE) 200 MG/ML injection Inject 200 mg into the muscle as directed.   tirzepatide (ZEPBOUND) 2.5 MG/0.5ML Pen Inject 2.5 mg into the skin once a week.   triamterene-hydrochlorothiazide (MAXZIDE-25) 37.5-25 MG tablet Take 1 tablet by mouth daily.   valsartan (DIOVAN) 320 MG tablet Take 320 mg by mouth daily.   budesonide (PULMICORT) 0.5 MG/2ML nebulizer solution Take 0.5 mg by nebulization.   No facility-administered encounter medications on file as of 03/07/2023.    Social History: Social History   Tobacco Use   Smoking status: Every Day    Current packs/day: 0.50    Types: Cigarettes   Smokeless tobacco: Former  Substance Use Topics   Alcohol use: Not Currently  Alcohol/week: 21.0 standard drinks of alcohol    Types: 21 Shots of liquor per week   Drug use: No    Family Medical History: Family History  Problem Relation Age of Onset   Hypertension Mother    Hyperlipidemia Father    Hypertension Father    Colon cancer Brother    Hyperlipidemia Maternal Grandfather    Hyperlipidemia Paternal Grandmother    Hyperlipidemia Paternal Grandfather     Physical Examination: Vitals:   03/07/23 1316  BP: 130/80    General: Patient is well developed,  well nourished, calm, collected, and in no apparent distress. Attention to examination is appropriate.  Respiratory: Patient is breathing without any difficulty.   NEUROLOGICAL:     Awake, alert, oriented to person, place, and time.  Speech is clear and fluent. Fund of knowledge is appropriate.   Cranial Nerves: Pupils equal round and reactive to light.  Facial tone is symmetric.    He has mid thoracic tenderness between his shoulder blades down to lower thoracic area. Also with medial scapular tenderness bilaterally.   Good ROM of shoulders with no pain. He his chronic proximal bicep tendon ruptures.   No abnormal lesions on exposed skin.   Strength: Side Biceps Triceps Deltoid Interossei Grip Wrist Ext. Wrist Flex.  R 5 5 5 5 5 5 5   L 5 5 5 5 5 5 5    Side Iliopsoas Quads Hamstring PF DF EHL  R 5 5 5 5 5 5   L 5 5 5 5 5 5    Reflexes are 2+ and symmetric at the biceps, brachioradialis, patella and achilles.   Hoffman's is absent.  Clonus is not present.   Bilateral upper and lower extremity sensation is intact to light touch, but he has some subjective diminished sensation in left lateral shoulder.   Gait is normal.     Medical Decision Making  Imaging: No thoracic imaging.   Assessment and Plan: Mr. Geathers has constant upper to mid thoracic pain between his shoulder blades x years that varies in severity. He notes some intermittent spasms into his ribs on both sides. He has constant numbness/tingling around to his chest at his nipples x 6-12 months. No radiation of pain to arms or legs. No weakness in his arms or legs.   No thoracic imaging. Component of pain may be myofascial as he has tenderness on exam, but this would not explain radicular numbness/tingling.   Treatment options discussed with patient and following plan made:   - MRI of thoracic spine to evaluate chronic thoracic pain with some radicular numbness/tingling.  - Discussed trial of robaxin. He declines for  now.  - Will schedule phone visit to review MRI results once I get them back.  - Depending on MRI results, may consider PT and/or referral to pain management for possible injections.   I spent a total of 25 minutes in face-to-face and non-face-to-face activities related to this patient's care today including review of outside records, review of imaging, review of symptoms, physical exam, discussion of differential diagnosis, discussion of treatment options, and documentation.   Thank you for involving me in the care of this patient.   Drake Leach PA-C Dept. of Neurosurgery

## 2023-03-07 ENCOUNTER — Ambulatory Visit (INDEPENDENT_AMBULATORY_CARE_PROVIDER_SITE_OTHER): Payer: BC Managed Care – PPO | Admitting: Orthopedic Surgery

## 2023-03-07 ENCOUNTER — Encounter: Payer: Self-pay | Admitting: Orthopedic Surgery

## 2023-03-07 VITALS — BP 130/80

## 2023-03-07 DIAGNOSIS — M5414 Radiculopathy, thoracic region: Secondary | ICD-10-CM | POA: Diagnosis not present

## 2023-03-07 DIAGNOSIS — G8929 Other chronic pain: Secondary | ICD-10-CM | POA: Diagnosis not present

## 2023-03-07 DIAGNOSIS — M546 Pain in thoracic spine: Secondary | ICD-10-CM | POA: Diagnosis not present

## 2023-03-07 NOTE — Patient Instructions (Signed)
It was so nice to see you today. Thank you so much for coming in.    I want to get an MRI of your mid back to look into things further. We will get this approved through your insurance and [facility] will call you to schedule the appointment.   [facility] (building with the white pillars) is located off of [place]. The address is [address] [place], [place] [address].    After you have the MRI, it takes 14-21 days for me to get the results back. Once I have them, we will call you to schedule a follow up phone visit with me to review them.   Please do not hesitate to call if you have any questions or concerns. You can also message me in MyChart.   Drake Leach PA-C [phone]     The physicians and staff at [facility] at [facility] are committed to providing excellent care. You may receive a survey asking for feedback about your experience at our office. We value you your feedback and appreciate you taking the time to to fill it out. The [facility] leadership team is also available to discuss your experience in person, feel free to contact us [phone].

## 2023-03-15 NOTE — Addendum Note (Signed)
Addended by: Ernie Hew on: 03/15/2023 04:34 PM   Modules accepted: Orders

## 2023-03-26 ENCOUNTER — Ambulatory Visit
Admission: RE | Admit: 2023-03-26 | Discharge: 2023-03-26 | Disposition: A | Discharge status: Home / Self Care | Payer: BC Managed Care – PPO | Source: Ambulatory Visit | Attending: Orthopedic Surgery | Admitting: Orthopedic Surgery

## 2023-03-26 DIAGNOSIS — G8929 Other chronic pain: Secondary | ICD-10-CM

## 2023-03-26 DIAGNOSIS — M5414 Radiculopathy, thoracic region: Secondary | ICD-10-CM

## 2023-04-08 NOTE — Progress Notes (Signed)
 Telephone Visit- Progress Note: Referring Physician:  Jerl Mina, MD [address] [place],  [place] [address]  Primary Physician:  Jerl Mina, MD  This visit was performed via telephone.  Patient location: home Provider location: office  I spent a total of 10 minutes non-face-to-face activities for this visit on the date of this encounter including review of current clinical condition and response to treatment.    Patient has given verbal consent to this telephone visits and we reviewed the limitations of a telephone visit. Patient wishes to proceed.    Chief Complaint:  review thoracic MRI results  History of Present Illness: John Mckinney is a 58 y.o. male has a history of history of HTN, high cholesterol, hypogonadism.    He is a retired Adult nurse.   Last seen by me on 03/07/23 for chronic thoracic pain. Phone visit scheduled to review thoracic MRI results.   He is about the same. He continues with constant upper to mid thoracic pain between his shoulder blades along with intermittent spasms into his ribs on both sides. He has constant numbness/tingling around to his chest at his nipples. No radiation of pain to arms or legs. No weakness in his arms or legs.    He is taking mobic.    Bowel/Bladder Dysfunction: none   He smokes about 1/2ppd. He restarted 2 years ago.     Conservative measures:  Physical therapy: has not participated in PT  Multimodal medical therapy including regular antiinflammatories: Meloxicam Injections: no epidural steroid injections   Past Surgery: none   Sandria Senter has no symptoms of cervical myelopathy.   The symptoms are causing a significant impact on the patient's life.   Exam: No exam done as this was a telephone encounter.     Imaging: Thoracic MRI dated 03/26/23:  FINDINGS: Alignment:  Normal.   Vertebrae: No acute fracture. Mild multilevel Modic type 1 degenerative endplate changes  in the mid and lower thoracic spine. Prominent marrow edema on both sides of the right T11 costovertebral joint with a small joint effusion.   Cord:  Normal signal and morphology.   Paraspinal and other soft tissues: Soft tissue edema surrounding the right T11 costovertebral joint. 1.7 cm T2 hyperintense lesion in the right hepatic lobe, unchanged in size from the prior CT and compatible with a cyst.   Disc levels:   Up to mild multilevel disc space narrowing and degenerative spurring. No more than minimal disc bulging. No sizable disc herniation or stenosis.   IMPRESSION: 1. 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 2. Mild thoracic disc degeneration without stenosis.     Electronically Signed   By: Sebastian Ache M.D.   On: 04/08/2023 08:25    I have personally reviewed the images and agree with the above interpretation.  Assessment and Plan: [name] continues with constant upper to mid thoracic pain between his shoulder blades along with intermittent spasms into his ribs on both sides. He has constant numbness/tingling around to his chest at his nipples. No radiation of pain to arms or legs.   He has known arthritis involving right T11 costovertebral joint along with mild thoracic DDD. No stenosis.   He does have pain on right at T11, but most of his pain continues to be mid thoracic.    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 He will start some things on his own for now.  - Recommend referral to Dr. Cherylann Ratel to consider injections. He declines for now.  - He will follow up prn at his request. I will message him in 6 weeks to check on his progress.    Drake Leach PA-C Neurosurgery

## 2023-04-12 ENCOUNTER — Encounter: Payer: Self-pay | Admitting: Orthopedic Surgery

## 2023-04-12 ENCOUNTER — Ambulatory Visit (INDEPENDENT_AMBULATORY_CARE_PROVIDER_SITE_OTHER): Payer: BC Managed Care – PPO | Admitting: Orthopedic Surgery

## 2023-04-12 DIAGNOSIS — M5184 Other intervertebral disc disorders, thoracic region: Secondary | ICD-10-CM

## 2023-04-12 DIAGNOSIS — G8929 Other chronic pain: Secondary | ICD-10-CM

## 2023-04-12 DIAGNOSIS — M47814 Spondylosis without myelopathy or radiculopathy, thoracic region: Secondary | ICD-10-CM

## 2023-07-13 DIAGNOSIS — M47814 Spondylosis without myelopathy or radiculopathy, thoracic region: Secondary | ICD-10-CM

## 2023-08-19 NOTE — Progress Notes (Deleted)
 Referring Physician:  Valora Agent, MD [address] [place],  [place] [address]  Primary Physician:  Valora Agent, MD  History of Present Illness: 08/19/2023 [name] has a history of HTN, high cholesterol, hypogonadism.   He is a retired Adult nurse.   Last did phone visit with me on 04/12/23 to review his thoracic MRI. He has known arthritis involving right T11 costovertebral joint along with mild thoracic DDD. No stenosis.   He was sent to PT and referred to pain management. He has not been scheduled yet with Dr. Marcelino (they called him and left a message).        He continues with constant upper to mid thoracic pain between his shoulder blades along with intermittent spasms into his ribs on both sides. He has constant numbness/tingling around to his chest at his nipples. No radiation of pain to arms or legs. No weakness in his arms or legs.  He is taking mobic.   Bowel/Bladder Dysfunction: none  He smokes about 1/2ppd. He restarted 2 years ago.    Conservative measures:  Physical therapy: has not participated in PT  Multimodal medical therapy including regular antiinflammatories: Meloxicam Injections: no epidural steroid injections  Past Surgery: none  John Mckinney has no symptoms of cervical myelopathy.  The symptoms are causing a significant impact on the patient's life.   Review of Systems:  A 10 point review of systems is negative, except for the pertinent positives and negatives detailed in the HPI.  Past Medical History: Past Medical History:  Diagnosis Date   Asthma    History of adenomatous polyp of colon 12/25/2012   Hyperlipidemia    Hypertension 12/25/2012   Hypogonadism in male     Past Surgical History: Past Surgical History:  Procedure Laterality Date   CLOSED REDUCTION WRIST FRACTURE     pt has screw and plate from surgery   COLONOSCOPY  12/24/2020   KNEE ARTHROSCOPY W/ ACL RECONSTRUCTION   02/16/1996   ACL Reconstruction   OSTEOTOMY AND ULNAR SHORTENING     Ulnar and Radial fracture   TONSILLECTOMY Bilateral age 1    Allergies: Allergies as of 08/29/2023   (No Known Allergies)    Medications: Outpatient Encounter Medications as of 08/29/2023  Medication Sig   albuterol  (VENTOLIN  HFA) 108 (90 Base) MCG/ACT inhaler Inhale 1-2 puffs into the lungs every 6 (six) hours as needed for wheezing or shortness of breath.   budesonide (PULMICORT) 0.5 MG/2ML nebulizer solution Take 0.5 mg by nebulization.   carvedilol  (COREG ) 25 MG tablet Take 25 mg by mouth 2 (two) times daily.   fluticasone  (FLONASE ) 50 MCG/ACT nasal spray Place 2 sprays into both nostrils daily.   loratadine  (CLARITIN ) 10 MG tablet Take 10 mg by mouth daily.   meloxicam (MOBIC) 15 MG tablet Take 1 tablet by mouth daily.   sertraline  (ZOLOFT ) 100 MG tablet Take 100 mg by mouth daily.   simvastatin  (ZOCOR ) 10 MG tablet Take 10 mg by mouth daily.   testosterone  cypionate (DEPOTESTOSTERONE CYPIONATE) 200 MG/ML injection Inject 200 mg into the muscle as directed.   tirzepatide (ZEPBOUND) 2.5 MG/0.5ML Pen Inject 2.5 mg into the skin once a week.   triamterene-hydrochlorothiazide (MAXZIDE-25) 37.5-25 MG tablet Take 1 tablet by mouth daily.   valsartan (DIOVAN) 320 MG tablet Take 320 mg by mouth daily.   No facility-administered encounter medications on file as of 08/29/2023.    Social History: Social History   Tobacco Use  Smoking status: Every Day    Current packs/day: 0.50    Types: Cigarettes   Smokeless tobacco: Former  Substance Use Topics   Alcohol use: Not Currently    Alcohol/week: 21.0 standard drinks of alcohol    Types: 21 Shots of liquor per week   Drug use: No    Family Medical History: Family History  Problem Relation Age of Onset   Hypertension Mother    Hyperlipidemia Father    Hypertension Father    Colon cancer Brother    Hyperlipidemia Maternal Grandfather    Hyperlipidemia  Paternal Grandmother    Hyperlipidemia Paternal Grandfather     Physical Examination: There were no vitals filed for this visit.    Awake, alert, oriented to person, place, and time.  Speech is clear and fluent. Fund of knowledge is appropriate.   Cranial Nerves: Pupils equal round and reactive to light.  Facial tone is symmetric.    He has mid thoracic tenderness between his shoulder blades down to lower thoracic area. Also with medial scapular tenderness bilaterally.   Good ROM of shoulders with no pain. He his chronic proximal bicep tendon ruptures.   No abnormal lesions on exposed skin.   Strength: Side Biceps Triceps Deltoid Interossei Grip Wrist Ext. Wrist Flex.  R 5 5 5 5 5 5 5   L 5 5 5 5 5 5 5    Side Iliopsoas Quads Hamstring PF DF EHL  R 5 5 5 5 5 5   L 5 5 5 5 5 5    Reflexes are 2+ and symmetric at the biceps, brachioradialis, patella and achilles.   Hoffman's is absent.  Clonus is not present.   Bilateral upper and lower extremity sensation is intact to light touch, but he has some subjective diminished sensation in left lateral shoulder.   Gait is normal.     Medical Decision Making  Imaging: None  Assessment and Plan: John Mckinney continues with constant upper to mid thoracic pain between his shoulder blades along with intermittent spasms into his ribs on both sides. He has constant numbness/tingling around to his chest at his nipples. No radiation of pain to arms or legs.    He has known arthritis involving right T11 costovertebral joint along with mild thoracic DDD. No stenosis.    He does have pain on right at T11, but most of his pain continues to be mid thoracic.    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 He will start some things on his own for now.  - Recommend referral to Dr. Marcelino to consider injections. He declines for now.  - He will follow up prn at his request. I will  message him in 6 weeks to check on his progress.    I spent a total of *** minutes in face-to-face and non-face-to-face activities related to this patient's care today including review of outside records, review of imaging, review of symptoms, physical exam, discussion of differential diagnosis, discussion of treatment options, and documentation.   Glade Boys PA-C Dept. of Neurosurgery

## 2023-08-22 NOTE — Telephone Encounter (Signed)
 He has appt with me on 7/14. Please push it back 6 weeks and let him know.   Thanks!

## 2023-08-29 ENCOUNTER — Ambulatory Visit: Admitting: Orthopedic Surgery

## 2023-09-01 ENCOUNTER — Ambulatory Visit
Attending: Student in an Organized Health Care Education/Training Program | Admitting: Student in an Organized Health Care Education/Training Program

## 2023-09-01 ENCOUNTER — Encounter: Payer: Self-pay | Admitting: Student in an Organized Health Care Education/Training Program

## 2023-09-01 VITALS — BP 135/94 | HR 86 | Temp 98.1°F | Resp 16 | Ht 65.0 in | Wt 165.0 lb

## 2023-09-01 DIAGNOSIS — M5134 Other intervertebral disc degeneration, thoracic region: Secondary | ICD-10-CM | POA: Diagnosis not present

## 2023-09-01 DIAGNOSIS — M7918 Myalgia, other site: Secondary | ICD-10-CM | POA: Insufficient documentation

## 2023-09-01 DIAGNOSIS — M47894 Other spondylosis, thoracic region: Secondary | ICD-10-CM | POA: Diagnosis present

## 2023-09-01 DIAGNOSIS — G894 Chronic pain syndrome: Secondary | ICD-10-CM | POA: Diagnosis present

## 2023-09-01 MED ORDER — CELECOXIB 100 MG PO CAPS: 100.0000 mg | ORAL_CAPSULE | Freq: Two times a day (BID) | ORAL | 1 refills | Status: AC | PRN

## 2023-09-01 MED ORDER — GABAPENTIN 100 MG PO CAPS: 100.0000 mg | ORAL_CAPSULE | Freq: Every day | ORAL | 0 refills | Status: AC

## 2023-09-01 NOTE — Progress Notes (Signed)
 Safety precautions to be maintained throughout the outpatient stay will include: orient to surroundings, keep bed in low position, maintain call bell within reach at all times, provide assistance with transfer out of bed and ambulation.

## 2023-09-01 NOTE — Progress Notes (Signed)
 PROVIDER NOTE: Interpretation of information contained herein should be left to medically-trained personnel. Specific patient instructions are provided elsewhere under Patient Instructions section of medical record. This document was created in part using AI and STT-dictation technology, any transcriptional errors that may result from this process are unintentional.  Patient: John Mckinney  Service: E/M Encounter  Provider: Wallie Sherry, MD  DOB: 05/31/1965  Delivery: Face-to-face  Specialty: Interventional Pain Management  MRN: [id number]  Setting: Ambulatory outpatient facility  Specialty designation: 09  Type: New Patient  Location: Outpatient office facility  PCP: Valora Agent, MD  DOS: 09/01/2023    Referring Prov.: Hilma Hastings, PA-C   Primary Reason(s) for Visit: Encounter for initial evaluation of one or more chronic problems (new to examiner) potentially causing chronic pain, and posing a threat to normal musculoskeletal function. (Level of risk: High) CC: Back Pain  HPI  [name] is a 58 y.o. year old, male patient, who comes for the first time to our practice referred by Hilma Hastings, PA-C for our initial evaluation of his chronic pain. He has Essential hypertension; History of adenomatous polyp of colon; Benign localized hyperplasia of prostate with urinary obstruction; Bilateral inguinal hernia; Hypogonadism male; ED (erectile dysfunction) of organic origin; Lethargy; Dyslipidemia; Positive PPD, treated; Encounter for long-term (current) use of high-risk medication; Obesity; Hx of colonic polyps; Influenzal pneumonia; Acute asthma exacerbation; Puerperal sepsis with acute hypoxic respiratory failure (HCC); Acute respiratory failure with hypoxia (HCC); Chest wall hematoma; Acute blood loss anemia; Thoracic degenerative disc disease; Thoracic facet joint syndrome; Chronic pain syndrome; and Myofascial pain syndrome of thoracic spine on their problem list. Today he comes in for evaluation of  his Back Pain  Pain Assessment: Location: Mid Back Radiating: bilateral supra scapular area and around to nipples bilat, sometimes manifests as chest pain Onset: More than a month ago Duration: Chronic pain Quality: Spasm, Radiating, Constant, Numbness, Other (Comment) (constant itch) Severity: 2 /10 (subjective, self-reported pain score)  Effect on ADL: denies Timing: Constant Modifying factors: stretching BP: (!) 135/94  HR: 86  Onset and Duration: Gradual Cause of pain: Unknown Severity: NAS-11 at its worse: 8/10, NAS-11 at its best: 2/10, NAS-11 now: 4/10, and NAS-11 on the average: 5/10 Timing: worse after activity/exercise Aggravating Factors: Lifiting and Twisting Alleviating Factors: Stretching, Hot packs, and PT Associated Problems: Numbness, Spasms, and Tingling Quality of Pain: Constant Previous Examinations or Tests: CT scan, MRI scan, X-rays, and Neurosurgical evaluation Previous Treatments: Physical Therapy and Stretching exercises  Mr. Cudworth is being evaluated for possible interventional pain management therapies for the treatment of his chronic pain.   Discussed the use of AI scribe software for clinical note transcription with the patient, who gave verbal consent to proceed.  History of Present Illness   John Mckinney is a 58 year old male who presents with chronic thoracic pain. He was referred by neurosurgery for evaluation of his chronic thoracic pain.  He experiences chronic mid back pain with associated paresthesia and radicular symptoms originating from T3 to T5, involving the long thoracic nerve. The pain radiates into both shoulder blades and occasionally to the front. It is chronic and persistent, affecting the paraspinals from T3 down to T8.  He has a long history of thoracic pain, which has been his primary area of back pain. In 2020, he was hospitalized with influenza, during which he experienced violent coughing that resulted in broken ribs and  torn intercostal muscles. Since then, he has had episodes of winged scapula, twice on  the right and once on the left, with the most recent episode occurring three months ago. The right scapula has not fully recovered.  He recently completed a course of physical therapy, with his last session occurring last week. The therapy included mobilizations that helped centralize the paresthesia but did not significantly alleviate the chronic pain. His insurance covered seven visits.  He has undergone multiple MRIs and CT scans, which revealed degenerative changes, asymmetric discs between T6 and T9, and mild disc herniation at T11.  Regarding medication, he has a prescription for meloxicam for his knee but does not take it consistently. He has previously taken gabapentin  but is not currently on it.       Meds   Current Outpatient Medications:    albuterol  (VENTOLIN  HFA) 108 (90 Base) MCG/ACT inhaler, Inhale 1-2 puffs into the lungs every 6 (six) hours as needed for wheezing or shortness of breath., Disp: , Rfl:    carvedilol  (COREG ) 25 MG tablet, Take 25 mg by mouth 2 (two) times daily., Disp: , Rfl:    celecoxib  (CELEBREX ) 100 MG capsule, Take 1 capsule (100 mg total) by mouth 2 (two) times daily as needed., Disp: 60 capsule, Rfl: 1   fluticasone  (FLONASE ) 50 MCG/ACT nasal spray, Place 2 sprays into both nostrils daily., Disp: 16 g, Rfl: 6   gabapentin  (NEURONTIN ) 100 MG capsule, Take 1-3 capsules (100-300 mg total) by mouth at bedtime., Disp: 180 capsule, Rfl: 0   loratadine  (CLARITIN ) 10 MG tablet, Take 10 mg by mouth daily., Disp: , Rfl:    sertraline  (ZOLOFT ) 100 MG tablet, Take 100 mg by mouth daily., Disp: , Rfl:    simvastatin  (ZOCOR ) 10 MG tablet, Take 10 mg by mouth daily., Disp: , Rfl:    testosterone  cypionate (DEPOTESTOSTERONE CYPIONATE) 200 MG/ML injection, Inject 200 mg into the muscle as directed., Disp: , Rfl: 5   triamterene-hydrochlorothiazide (MAXZIDE-25) 37.5-25 MG tablet, Take 1  tablet by mouth daily., Disp: , Rfl:    valsartan (DIOVAN) 320 MG tablet, Take 320 mg by mouth daily., Disp: , Rfl:   Imaging Review  MR THORACIC SPINE WO CONTRAST  Narrative CLINICAL DATA:  Chronic thoracic back pain, worse in the last 6 months.  EXAM: MRI THORACIC SPINE WITHOUT CONTRAST  TECHNIQUE: Multiplanar, multisequence MR imaging of the thoracic spine was performed. No intravenous contrast was administered.  COMPARISON:  CTA chest, abdomen, and pelvis 01/23/2020  FINDINGS: Alignment:  Normal.  Vertebrae: No acute fracture. Mild multilevel Modic type 1 degenerative endplate changes in the mid and lower thoracic spine. Prominent marrow edema on both sides of the right T11 costovertebral joint with a small joint effusion.  Cord:  Normal signal and morphology.  Paraspinal and other soft tissues: Soft tissue edema surrounding the right T11 costovertebral joint. 1.7 cm T2 hyperintense lesion in the right hepatic lobe, unchanged in size from the prior CT and compatible with a cyst.  Disc levels:  Up to mild multilevel disc space narrowing and degenerative spurring. No more than minimal disc bulging. No sizable disc herniation or stenosis.  IMPRESSION: 1. 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 2. Mild thoracic disc degeneration without stenosis.   Electronically Signed By: Dasie Hamburg M.D. On: 04/08/2023 08:25   Complexity Note: Imaging results reviewed.                         ROS  Cardiovascular: High blood pressure Pulmonary  or Respiratory: Smoking Neurological: No reported neurological signs or symptoms such as seizures, abnormal skin sensations, urinary and/or fecal incontinence, being born with an abnormal open spine and/or a tethered spinal cord Psychological-Psychiatric: No reported psychological or psychiatric signs or symptoms such as difficulty sleeping, anxiety, depression,  delusions or hallucinations (schizophrenial), mood swings (bipolar disorders) or suicidal ideations or attempts Gastrointestinal: No reported gastrointestinal signs or symptoms such as vomiting or evacuating blood, reflux, heartburn, alternating episodes of diarrhea and constipation, inflamed or scarred liver, or pancreas or irrregular and/or infrequent bowel movements Genitourinary: No reported renal or genitourinary signs or symptoms such as difficulty voiding or producing urine, peeing blood, non-functioning kidney, kidney stones, difficulty emptying the bladder, difficulty controlling the flow of urine, or chronic kidney disease Hematological: No reported hematological signs or symptoms such as prolonged bleeding, low or poor functioning platelets, bruising or bleeding easily, hereditary bleeding problems, low energy levels due to low hemoglobin or being anemic Endocrine: No reported endocrine signs or symptoms such as high or low blood sugar, rapid heart rate due to high thyroid levels, obesity or weight gain due to slow thyroid or thyroid disease Rheumatologic: Joint aches and or swelling due to excess weight (Osteoarthritis) Musculoskeletal: Negative for myasthenia gravis, muscular dystrophy, multiple sclerosis or malignant hyperthermia Work History: Retired  Allergies  Mr. Mapps has no known allergies.  Laboratory Chemistry Profile   Renal Lab Results  Component Value Date   BUN 25 (H) 01/25/2020   CREATININE 0.87 01/25/2020   BCR 14 06/24/2015   GFR 104.26 03/28/2013   GFRAA 107 06/24/2015   GFRNONAA >60 01/25/2020   PROTEINUR NEGATIVE 08/03/2022     Electrolytes Lab Results  Component Value Date   NA 139 01/25/2020   K 3.9 01/25/2020   CL 104 01/25/2020   CALCIUM 8.4 (L) 01/25/2020   MG 2.3 01/24/2020     Hepatic Lab Results  Component Value Date   AST 22 06/24/2015   ALT 30 06/24/2015   ALBUMIN 4.3 06/24/2015   ALKPHOS 57 06/24/2015   LIPASE 145 04/12/2013      ID Lab Results  Component Value Date   HIV Non Reactive 01/23/2020   SARSCOV2NAA NEGATIVE 01/23/2020     Bone Lab Results  Component Value Date   TESTOFREE 48.6 03/28/2013   TESTOSTERONE  351 03/28/2013     Endocrine Lab Results  Component Value Date   GLUCOSE 88 01/25/2020   GLUCOSEU NEGATIVE 08/03/2022   TSH 2.740 06/24/2015   TESTOFREE 48.6 03/28/2013   TESTOSTERONE  351 03/28/2013   SHBG 57 03/28/2013     Neuropathy Lab Results  Component Value Date   VITAMINB12 222 01/24/2020   FOLATE 13.5 01/24/2020   HIV Non Reactive 01/23/2020     CNS No results found for: COLORCSF, APPEARCSF, RBCCOUNTCSF, WBCCSF, POLYSCSF, LYMPHSCSF, EOSCSF, PROTEINCSF, GLUCCSF, JCVIRUS, CSFOLI, IGGCSF, LABACHR, ACETBL   Inflammation (CRP: Acute  ESR: Chronic) No results found for: CRP, ESRSEDRATE, LATICACIDVEN   Rheumatology No results found for: RF, ANA, LABURIC, URICUR, LYMEIGGIGMAB, LYMEABIGMQN, HLAB27   Coagulation Lab Results  Component Value Date   PLT 201 01/25/2020     Cardiovascular Lab Results  Component Value Date   CKTOTAL 193 04/11/2013   CKMB 1.8 04/11/2013   TROPONINI < 0.02 04/11/2013   HGB 11.3 (L) 01/25/2020   HCT 34.2 (L) 01/25/2020     Screening Lab Results  Component Value Date   SARSCOV2NAA NEGATIVE 01/23/2020   HIV Non Reactive 01/23/2020     Cancer No results found  for: CEA, CA125, LABCA2   Allergens No results found for: ALMOND, APPLE, ASPARAGUS, AVOCADO, BANANA, BARLEY, BASIL, BAYLEAF, GREENBEAN, LIMABEAN, WHITEBEAN, BEEFIGE, REDBEET, BLUEBERRY, BROCCOLI, CABBAGE, MELON, CARROT, CASEIN, CASHEWNUT, CAULIFLOWER, CELERY     Note: Lab results reviewed.  PFSH  Drug: Mr. Sudduth  reports no history of drug use. Alcohol:  reports that he does not currently use alcohol after a past usage of about 21.0 standard drinks of alcohol per week. Tobacco:  reports  that he has been smoking cigarettes. He has quit using smokeless tobacco. Medical:  has a past medical history of Asthma, History of adenomatous polyp of colon (12/25/2012), Hyperlipidemia, Hypertension (12/25/2012), and Hypogonadism in male. Family: family history includes Colon cancer in his brother; Hyperlipidemia in his father, maternal grandfather, paternal grandfather, and paternal grandmother; Hypertension in his father and mother.  Past Surgical History:  Procedure Laterality Date   CLOSED REDUCTION WRIST FRACTURE     pt has screw and plate from surgery   COLONOSCOPY  12/24/2020   KNEE ARTHROSCOPY W/ ACL RECONSTRUCTION  02/16/1996   ACL Reconstruction   OSTEOTOMY AND ULNAR SHORTENING     Ulnar and Radial fracture   TONSILLECTOMY Bilateral age 21   Active Ambulatory Problems    Diagnosis Date Noted   Essential hypertension 12/25/2012   History of adenomatous polyp of colon 12/25/2012   Benign localized hyperplasia of prostate with urinary obstruction 05/16/2013   Bilateral inguinal hernia 05/24/2014   Hypogonadism male 05/16/2013   ED (erectile dysfunction) of organic origin 05/16/2013   Lethargy 05/16/2013   Dyslipidemia 08/16/2014   Positive PPD, treated 04/10/2015   Encounter for long-term (current) use of high-risk medication 05/26/2015   Obesity 05/26/2015   Hx of colonic polyps 05/26/2015   Influenzal pneumonia 01/23/2020   Acute asthma exacerbation 01/23/2020   Puerperal sepsis with acute hypoxic respiratory failure (HCC) 01/23/2020   Acute respiratory failure with hypoxia (HCC) 01/23/2020   Chest wall hematoma 01/25/2020   Acute blood loss anemia 01/25/2020   Thoracic degenerative disc disease 09/01/2023   Thoracic facet joint syndrome 09/01/2023   Chronic pain syndrome 09/01/2023   Myofascial pain syndrome of thoracic spine 09/01/2023   Resolved Ambulatory Problems    Diagnosis Date Noted   Polypharmacy 05/24/2014   Past Medical History:  Diagnosis Date    Asthma    Hyperlipidemia    Hypertension 12/25/2012   Hypogonadism in male    Constitutional Exam  General appearance: Well nourished, well developed, and well hydrated. In no apparent acute distress Vitals:   09/01/23 0820  BP: (!) 135/94  Pulse: 86  Resp: 16  Temp: 98.1 F (36.7 C)  SpO2: 99%  Weight: 165 lb (74.8 kg)  Height: 5' 5 (1.651 m)   BMI Assessment: Estimated body mass index is 27.46 kg/m as calculated from the following:   Height as of this encounter: 5' 5 (1.651 m).   Weight as of this encounter: 165 lb (74.8 kg).  BMI interpretation table: BMI level Category Range association with higher incidence of chronic pain  <18 kg/m2 Underweight   18.5-24.9 kg/m2 Ideal body weight   25-29.9 kg/m2 Overweight Increased incidence by 20%  30-34.9 kg/m2 Obese (Class I) Increased incidence by 68%  35-39.9 kg/m2 Severe obesity (Class II) Increased incidence by 136%  >40 kg/m2 Extreme obesity (Class III) Increased incidence by 254%   Patient's current BMI Ideal Body weight  Body mass index is 27.46 kg/m. Ideal body weight: 61.5 kg (135 lb 9.3 oz) Adjusted ideal body weight:  66.8 kg (147 lb 5.6 oz)   BMI Readings from Last 4 Encounters:  09/01/23 27.46 kg/m  08/03/22 33.81 kg/m  01/23/20 33.89 kg/m  05/05/16 32.11 kg/m   Wt Readings from Last 4 Encounters:  09/01/23 165 lb (74.8 kg)  08/03/22 197 lb (89.4 kg)  01/23/20 210 lb (95.3 kg)  05/05/16 205 lb (93 kg)    Psych/Mental status: Alert, oriented x 3 (person, place, & time)       Eyes: PERLA Respiratory: No evidence of acute respiratory distress  Thoracic pain, worse with thoracic extension and lateral rotation overlying T4-T10 area  Assessment  Primary Diagnosis & Pertinent Problem List: The primary encounter diagnosis was Thoracic degenerative disc disease. Diagnoses of Thoracic facet joint syndrome, Myofascial pain syndrome of thoracic spine, and Chronic pain syndrome were also pertinent to this  visit.  Visit Diagnosis (New problems to examiner): 1. Thoracic degenerative disc disease   2. Thoracic facet joint syndrome   3. Myofascial pain syndrome of thoracic spine   4. Chronic pain syndrome    Plan of Care (Initial workup plan)  Assessment and Plan    Thoracic degenerative disc disease   Chronic thoracic degenerative disc disease is present with asymmetric discs between T6-T9 and degeneration at the superior body of T6. MRI reveals modic changes and soft tissue edema, indicating severe disc disease with end plate erosion. There is no nerve compression or irritation, suggesting pain is due to arthritis or degeneration. Physical therapy and anti-inflammatory medications have had limited success. Prescribe Celebrex  100 mg twice daily as needed for 30 days. Consider nerve blocks if severe pain persists and continue physical therapy.  Chronic thoracic pain   Chronic thoracic pain extends from T3 to T8, radiating to both shoulder blades and occasionally to the front. Rib fractures and intercostal tears from violent coughing in 2020 contribute to ongoing pain. Physical therapy has centralized paresthesia but not significantly alleviated chronic pain. He has a prescription for meloxicam but does not take it consistently. Prescribe gabapentin  100 mg at night, increasing to 200 mg in the second week and 300 mg in the third week. Continue physical therapy.  Paresthesia in thoracic region   Paresthesia in the thoracic region has a neuropathic component, indicated by burning and tingling sensations. Symptoms suggest involvement of the long thoracic nerve, with winged scapula noted bilaterally. A short trial of gabapentin  is recommended for neuropathic pain relief. Consider nerve blocks if medications do not provide relief.        Pharmacotherapy (current): Medications ordered:  Meds ordered this encounter  Medications   celecoxib  (CELEBREX ) 100 MG capsule    Sig: Take 1 capsule (100 mg  total) by mouth 2 (two) times daily as needed.    Dispense:  60 capsule    Refill:  1   gabapentin  (NEURONTIN ) 100 MG capsule    Sig: Take 1-3 capsules (100-300 mg total) by mouth at bedtime.    Dispense:  180 capsule    Refill:  0   Medications administered during this visit: John C. Watlington had no medications administered during this visit.  Provider-requested follow-up: Return in about 8 weeks (around 10/27/2023) for 2nd pt visit (discuss thoracic facets MBNB).  Future Appointments  Date Time Provider Department Center  10/10/2023  9:30 AM Hilma Hastings, PA-C [facility] None   I discussed the assessment and treatment plan with the patient. The patient was provided an opportunity to ask questions and all were answered. The patient agreed with the plan and demonstrated  an understanding of the instructions.  Patient advised to call back or seek an in-person evaluation if the symptoms or condition worsens.  Duration of encounter: .  Total time on encounter, as per AMA guidelines included both the face-to-face and non-face-to-face time personally spent by the physician and/or other qualified health care professional(s) on the day of the encounter (includes time in activities that require the physician or other qualified health care professional and does not include time in activities normally performed by clinical staff). Physician's time may include the following activities when performed: Preparing to see the patient (e.g., pre-charting review of records, searching for previously ordered imaging, lab work, and nerve conduction tests) Review of prior analgesic pharmacotherapies. Reviewing PMP Interpreting ordered tests (e.g., lab work, imaging, nerve conduction tests) Performing post-procedure evaluations, including interpretation of diagnostic procedures Obtaining and/or reviewing separately obtained history Performing a medically appropriate examination and/or evaluation Counseling  and educating the patient/family/caregiver Ordering medications, tests, or procedures Referring and communicating with other health care professionals (when not separately reported) Documenting clinical information in the electronic or other health record Independently interpreting results (not separately reported) and communicating results to the patient/ family/caregiver Care coordination (not separately reported)  Note by: Wallie Sherry, MD (TTS and AI technology used. I apologize for any typographical errors that were not detected and corrected.) Date: 09/01/2023; Time: 8:50 AM

## 2023-10-10 ENCOUNTER — Ambulatory Visit: Admitting: Orthopedic Surgery

## 2023-10-27 ENCOUNTER — Ambulatory Visit: Admitting: Student in an Organized Health Care Education/Training Program

## 2023-10-27 ENCOUNTER — Other Ambulatory Visit: Payer: Self-pay | Admitting: Student in an Organized Health Care Education/Training Program

## 2023-10-27 DIAGNOSIS — G894 Chronic pain syndrome: Secondary | ICD-10-CM

## 2023-10-27 DIAGNOSIS — M5134 Other intervertebral disc degeneration, thoracic region: Secondary | ICD-10-CM

## 2023-10-27 DIAGNOSIS — M47894 Other spondylosis, thoracic region: Secondary | ICD-10-CM
